# Patient Record
Sex: Male | Born: 1950 | Race: Black or African American | Hispanic: No | Marital: Single | Smoking: Current every day smoker
Health system: Southern US, Community
[De-identification: ages and names within clinical notes are randomized; demographics above are authoritative.]

---

## 2020-01-26 ENCOUNTER — Encounter (HOSPITAL_COMMUNITY): Payer: Self-pay | Admitting: Neurology

## 2020-01-26 ENCOUNTER — Emergency Department (HOSPITAL_COMMUNITY): Payer: Medicare Other

## 2020-01-26 ENCOUNTER — Other Ambulatory Visit: Payer: Self-pay

## 2020-01-26 ENCOUNTER — Inpatient Hospital Stay (HOSPITAL_COMMUNITY)
Admission: EM | Admit: 2020-01-26 | Discharge: 2020-01-28 | DRG: 065 | Disposition: A | Discharge status: Home / Self Care | Payer: Medicare Other | Attending: Internal Medicine | Admitting: Internal Medicine

## 2020-01-26 DIAGNOSIS — N179 Acute kidney failure, unspecified: Secondary | ICD-10-CM | POA: Diagnosis not present

## 2020-01-26 DIAGNOSIS — I161 Hypertensive emergency: Secondary | ICD-10-CM | POA: Diagnosis not present

## 2020-01-26 DIAGNOSIS — R29706 NIHSS score 6: Secondary | ICD-10-CM | POA: Diagnosis present

## 2020-01-26 DIAGNOSIS — Z20822 Contact with and (suspected) exposure to covid-19: Secondary | ICD-10-CM | POA: Diagnosis present

## 2020-01-26 DIAGNOSIS — I610 Nontraumatic intracerebral hemorrhage in hemisphere, subcortical: Secondary | ICD-10-CM

## 2020-01-26 DIAGNOSIS — F17210 Nicotine dependence, cigarettes, uncomplicated: Secondary | ICD-10-CM | POA: Diagnosis present

## 2020-01-26 DIAGNOSIS — I10 Essential (primary) hypertension: Secondary | ICD-10-CM | POA: Diagnosis present

## 2020-01-26 DIAGNOSIS — R4781 Slurred speech: Secondary | ICD-10-CM | POA: Diagnosis present

## 2020-01-26 DIAGNOSIS — G8194 Hemiplegia, unspecified affecting left nondominant side: Secondary | ICD-10-CM | POA: Diagnosis present

## 2020-01-26 DIAGNOSIS — I6389 Other cerebral infarction: Secondary | ICD-10-CM | POA: Diagnosis not present

## 2020-01-26 DIAGNOSIS — I619 Nontraumatic intracerebral hemorrhage, unspecified: Secondary | ICD-10-CM | POA: Diagnosis present

## 2020-01-26 LAB — CBC
HCT: 44.7 % (ref 39.0–52.0)
Hemoglobin: 15 g/dL (ref 13.0–17.0)
MCH: 30.9 pg (ref 26.0–34.0)
MCHC: 33.6 g/dL (ref 30.0–36.0)
MCV: 92.2 fL (ref 80.0–100.0)
Platelets: 262 10*3/uL (ref 150–400)
RBC: 4.85 MIL/uL (ref 4.22–5.81)
RDW: 12.9 % (ref 11.5–15.5)
WBC: 5.2 10*3/uL (ref 4.0–10.5)
nRBC: 0 % (ref 0.0–0.2)

## 2020-01-26 LAB — COMPREHENSIVE METABOLIC PANEL
ALT: 27 U/L (ref 0–44)
AST: 35 U/L (ref 15–41)
Albumin: 3.7 g/dL (ref 3.5–5.0)
Alkaline Phosphatase: 65 U/L (ref 38–126)
Anion gap: 9 (ref 5–15)
BUN: 14 mg/dL (ref 8–23)
CO2: 25 mmol/L (ref 22–32)
Calcium: 9.1 mg/dL (ref 8.9–10.3)
Chloride: 105 mmol/L (ref 98–111)
Creatinine, Ser: 1.25 mg/dL — ABNORMAL HIGH (ref 0.61–1.24)
GFR calc Af Amer: >60 mL/min (ref >60)
GFR calc non Af Amer: 58 mL/min — ABNORMAL LOW (ref >60)
Glucose, Bld: 86 mg/dL (ref 70–99)
Potassium: 4.2 mmol/L (ref 3.5–5.1)
Sodium: 139 mmol/L (ref 135–145)
Total Bilirubin: 0.8 mg/dL (ref 0.3–1.2)
Total Protein: 6.9 g/dL (ref 6.5–8.1)

## 2020-01-26 LAB — I-STAT CHEM 8, ED
BUN: 15 mg/dL (ref 8–23)
Calcium, Ion: 1.07 mmol/L — ABNORMAL LOW (ref 1.15–1.40)
Chloride: 106 mmol/L (ref 98–111)
Creatinine, Ser: 1 mg/dL (ref 0.61–1.24)
Glucose, Bld: 83 mg/dL (ref 70–99)
HCT: 45 % (ref 39.0–52.0)
Hemoglobin: 15.3 g/dL (ref 13.0–17.0)
Potassium: 4 mmol/L (ref 3.5–5.1)
Sodium: 139 mmol/L (ref 135–145)
TCO2: 22 mmol/L (ref 22–32)

## 2020-01-26 LAB — DIFFERENTIAL
Abs Immature Granulocytes: 0.01 10*3/uL (ref 0.00–0.07)
Basophils Absolute: 0.1 10*3/uL (ref 0.0–0.1)
Basophils Relative: 1 %
Eosinophils Absolute: 0.3 10*3/uL (ref 0.0–0.5)
Eosinophils Relative: 5 %
Immature Granulocytes: 0 %
Lymphocytes Relative: 23 %
Lymphs Abs: 1.2 10*3/uL (ref 0.7–4.0)
Monocytes Absolute: 0.5 10*3/uL (ref 0.1–1.0)
Monocytes Relative: 10 %
Neutro Abs: 3.1 10*3/uL (ref 1.7–7.7)
Neutrophils Relative %: 61 %

## 2020-01-26 LAB — RAPID URINE DRUG SCREEN, HOSP PERFORMED
Amphetamines: NOT DETECTED
Barbiturates: NOT DETECTED
Benzodiazepines: NOT DETECTED
Cocaine: NOT DETECTED
Opiates: NOT DETECTED
Tetrahydrocannabinol: POSITIVE — AB

## 2020-01-26 LAB — PROTIME-INR
INR: 1.1 (ref 0.8–1.2)
Prothrombin Time: 13.3 seconds (ref 11.4–15.2)

## 2020-01-26 LAB — URINALYSIS, ROUTINE W REFLEX MICROSCOPIC
Bilirubin Urine: NEGATIVE
Glucose, UA: NEGATIVE mg/dL
Hgb urine dipstick: NEGATIVE
Ketones, ur: NEGATIVE mg/dL
Leukocytes,Ua: NEGATIVE
Nitrite: NEGATIVE
Protein, ur: NEGATIVE mg/dL
Specific Gravity, Urine: 1.009 (ref 1.005–1.030)
pH: 6 (ref 5.0–8.0)

## 2020-01-26 LAB — HIV ANTIBODY (ROUTINE TESTING W REFLEX): HIV Screen 4th Generation wRfx: NONREACTIVE

## 2020-01-26 LAB — RESPIRATORY PANEL BY RT PCR (FLU A&B, COVID)
Influenza A by PCR: NEGATIVE
Influenza B by PCR: NEGATIVE
SARS Coronavirus 2 by RT PCR: NEGATIVE

## 2020-01-26 LAB — ETHANOL: Alcohol, Ethyl (B): <10 mg/dL (ref <10)

## 2020-01-26 LAB — MRSA PCR SCREENING: MRSA by PCR: NEGATIVE

## 2020-01-26 LAB — APTT: aPTT: 29 seconds (ref 24–36)

## 2020-01-26 MED ORDER — CLEVIDIPINE BUTYRATE 0.5 MG/ML IV EMUL
0.0000 mg/h | INTRAVENOUS | Status: DC
  Administered 2020-01-26: 4 mg/h via INTRAVENOUS
  Administered 2020-01-26: 6 mg/h via INTRAVENOUS
  Administered 2020-01-27 (×2): 2 mg/h via INTRAVENOUS
  Filled 2020-01-26 (×4): qty 50

## 2020-01-26 MED ORDER — STROKE: EARLY STAGES OF RECOVERY BOOK
Freq: Once | Status: AC
  Filled 2020-01-26 (×2): qty 1

## 2020-01-26 MED ORDER — SENNOSIDES-DOCUSATE SODIUM 8.6-50 MG PO TABS
1.0000 | ORAL_TABLET | Freq: Two times a day (BID) | ORAL | Status: DC
  Administered 2020-01-27 – 2020-01-28 (×2): 1 via ORAL
  Filled 2020-01-26 (×3): qty 1

## 2020-01-26 MED ORDER — ACETAMINOPHEN 325 MG PO TABS: 650.0000 mg | ORAL_TABLET | ORAL | Status: DC | PRN

## 2020-01-26 MED ORDER — CLEVIDIPINE BUTYRATE 0.5 MG/ML IV EMUL
INTRAVENOUS | Status: AC
  Administered 2020-01-26: 1 mg/h via INTRAVENOUS
  Filled 2020-01-26: qty 50

## 2020-01-26 MED ORDER — ACETAMINOPHEN 650 MG RE SUPP: 650.0000 mg | RECTAL | Status: DC | PRN

## 2020-01-26 MED ORDER — ACETAMINOPHEN 160 MG/5ML PO SOLN: 650.0000 mg | ORAL | Status: DC | PRN

## 2020-01-26 MED ORDER — SODIUM CHLORIDE 0.9 % IV SOLN: INTRAVENOUS | Status: DC

## 2020-01-26 MED ORDER — CHLORHEXIDINE GLUCONATE CLOTH 2 % EX PADS
6.0000 | MEDICATED_PAD | Freq: Every day | CUTANEOUS | Status: DC
  Administered 2020-01-27 – 2020-01-28 (×2): 6 via TOPICAL

## 2020-01-26 MED ORDER — LABETALOL HCL 5 MG/ML IV SOLN
20.0000 mg | Freq: Once | INTRAVENOUS | Status: AC
  Administered 2020-01-26: 20 mg via INTRAVENOUS

## 2020-01-26 MED ORDER — PANTOPRAZOLE SODIUM 40 MG IV SOLR
40.0000 mg | Freq: Every day | INTRAVENOUS | Status: DC
  Administered 2020-01-26: 40 mg via INTRAVENOUS
  Filled 2020-01-26: qty 40

## 2020-01-26 NOTE — H&P (Addendum)
Neurology history and physical  Reason for Consult: Code stroke Referring Physician: Estell Harpin  CC: Left-sided weakness  History is obtained from: Patient and also EMS  HPI: Paul Trevino is a 69 y.o. male with no past medical history.  Patient was at work when at 740 he noticed that he had generalized weakness.  Coworkers noted that he had left-sided facial droop along with left-sided weakness in the arm and leg.  EMS was called.  Patient's blood pressure was 180/90 in route.  Upon arriving to [facility] it was noted patient did have the left facial droop along with left-sided weakness.  Patient states that he does take aspirin however he does not take it on a regular basis.  Patient was immediately brought back to CT which revealed a right internal capsule intracranial hemorrhage.  Thus no TPA was given.  Patient's blood pressure was lowered with labetalol and if needed Cleviprex.  ED course  CT head shows-obtained and showed a right internal capsule hemorrhage  LKW: 0740 tpa given?: no, ICH Premorbid modified Rankin scale (mRS): 0  NIHSS 1a Level of Conscious.: 0 1b LOC Questions: 0 1c LOC Commands: 0 2 Best Gaze: 0 3 Visual: 0 4 Facial Palsy: 1 5a Motor Arm - left: 2 5b Motor Arm - Right: 0 6a Motor Leg - Left: 1 6b Motor Leg - Right: 0 7 Limb Ataxia: 2 8 Sensory: 0 9 Best Language: 0 10 Dysarthria: 0 11 Extinct. and Inatten.: 0 TOTAL: 6    ICH Score: 0    No past medical history on file. \ No family history on file.   Social History:   has no history on file for tobacco use, alcohol use, and drug use.  Medications  Current Facility-Administered Medications:     stroke: mapping our early stages of recovery book, , Does not apply, Once, Ulice Dash, PA-C   0.9 %  sodium chloride infusion, , Intravenous, Continuous, Ulice Dash, PA-C   acetaminophen (TYLENOL) tablet 650 mg, 650 mg, Oral, Q4H PRN **OR** acetaminophen (TYLENOL) 160 MG/5ML solution 650 mg, 650  mg, Per Tube, Q4H PRN **OR** acetaminophen (TYLENOL) suppository 650 mg, 650 mg, Rectal, Q4H PRN, Ulice Dash, PA-C   labetalol (NORMODYNE) injection 20 mg, 20 mg, Intravenous, Once **AND** clevidipine (CLEVIPREX) infusion 0.5 mg/mL, 0-21 mg/hr, Intravenous, Continuous, Ulice Dash, PA-C   pantoprazole (PROTONIX) injection 40 mg, 40 mg, Intravenous, QHS, Ulice Dash, PA-C   senna-docusate (Senokot-S) tablet 1 tablet, 1 tablet, Oral, BID, Ulice Dash, PA-C No current outpatient medications on file.  ROS:   General ROS: negative for - chills, fatigue, fever, night sweats, weight gain or weight loss Psychological ROS: negative for - behavioral disorder, hallucinations, memory difficulties, mood swings or suicidal ideation Ophthalmic ROS: negative for - blurry vision, double vision, eye pain or loss of vision ENT ROS: negative for - epistaxis, nasal discharge, oral lesions, sore throat, tinnitus or vertigo Allergy and Immunology ROS: negative for - hives or itchy/watery eyes Hematological and Lymphatic ROS: negative for - bleeding problems, bruising or swollen lymph nodes Endocrine ROS: negative for - galactorrhea, hair pattern changes, polydipsia/polyuria or temperature intolerance Respiratory ROS: negative for - cough, hemoptysis, shortness of breath or wheezing Cardiovascular ROS: negative for - chest pain, dyspnea on exertion, edema or irregular heartbeat Gastrointestinal ROS: negative for - abdominal pain, diarrhea, hematemesis, nausea/vomiting or stool incontinence Genito-Urinary ROS: negative for - dysuria, hematuria, incontinence or urinary frequency/urgency Musculoskeletal ROS: Positive for -  muscular weakness  Neurological ROS: as noted in HPI Dermatological ROS: negative for rash and skin lesion changes  Exam: Current vital signs: Wt 60.7 kg  Vital signs in last 24 hours: Weight:  [60.7 kg] 60.7 kg (09/30 0800)   Constitutional: Appears well-developed and  well-nourished.  Psych: Affect appropriate to situation Eyes: No scleral injection HENT: No OP obstrucion Head: Normocephalic.  Cardiovascular: Normal rate and regular rhythm.  Respiratory: Effort normal, non-labored breathing GI: Soft.  No distension. There is no tenderness.  Skin: WDI  Neuro: Mental Status: Patient is awake, alert, oriented to person, place, month, year, and situation.  Patient does not show any dysarthria.  Naming, repeating, comprehension is intact.  Patient is able to give a history.  Patient is able to follow commands without difficulty.  Cranial Nerves: II: Visual Fields are full.  III,IV, VI: EOMI without ptosis or diploplia. Pupils equal, round and reactive to light V: Facial sensation is symmetric to temperature VII: Slight left facial droop.  VIII: hearing is intact to voice X: Palat elevates symmetrically XI: Shoulder shrug is symmetric. XII: tongue is midline without atrophy or fasciculations.  Motor: Right arm and leg 5/5 left arm 5/5 and left leg antigravity Positive drift on both left arm and leg Sensory: Sensation is symmetric to light touch and temperature in the arms and legs. Deep Tendon Reflexes: 2+ and symmetric in the biceps and patellae.  It is noticed that he has crossed adduction with patella reflex Plantars: Upgoing on the left downgoing on the right Cerebellar: Finger-nose-finger showed dysmetria in bilateral arms and left leg mostly due to strength however  Labs I have reviewed labs in epic and the results pertinent to this consultation are:   CBC No results found for: WBC, RBC, HGB, HCT, PLT, MCV, MCH, MCHC, RDW, LYMPHSABS, MONOABS, EOSABS, BASOSABS  CMP  No results found for: NA, K, CL, CO2, GLUCOSE, BUN, CREATININE, CALCIUM, PROT, ALBUMIN, AST, ALT, ALKPHOS, BILITOT, GFRNONAA, GFRAA  Lipid Panel  No results found for: CHOL, TRIG, HDL, CHOLHDL, VLDL, LDLCALC, LDLDIRECT   Imaging I have reviewed the images  obtained:  CT-scan of the brain-right basal ganglier hemorrhage likely 3 to 4 cc.  Felicie Morn PA-C [facility] [phone]  M-F  (9:00 am- 5:00 PM)  01/26/2020, 9:12 AM     Assessment:  This is a 69 year old male with no past medical history and on no anticoagulants.  Patient arrived with left-sided symptoms of weakness.  Patient was immediately brought to CT scan which showed a right basal ganglia hemorrhage with likely 3 to 4 cc volume.  Exam as above, did show drift on left arm and leg with weakness.  Patient also showed a slight left facial droop.  Hemorrhage likely secondary to hypertension.  Impression:  -Left basal ganglier hemorrhage.  Plan -Admit to ICU -MRI of the brain without contrast -MRA Head and neck  -Transthoracic Echo,  -No anticoagulants or aspirin -Start or continue Atorvastatin 80 mg/other high intensity statin -BP goal: Less than 140 systolically and less than 90 diastolically-initially try labetalol if not sufficient start Cleviprex -HBAIC and Lipid profile -Telemetry monitoring -Frequent neuro checks -NPO until passes stroke swallow screen -PT/OT -SCDs for DVT prophylaxis    Patient seen and examined with Andres Labrum, PA. Agree with assessment and plan.  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 Patient needs admission to ICU for close monitoring and risk factor  identification and control.   Marisue Humble, MD Page: [phone]

## 2020-01-26 NOTE — ED Triage Notes (Signed)
Pt from  work via ems; LKW 0740 this am; pt's coworkers noted gait and speech changes and L facial droop; pt A and O x 4 on arrival

## 2020-01-26 NOTE — ED Provider Notes (Signed)
[facility] - Closed EMERGENCY DEPARTMENT Provider Note   CSN: [id number] Arrival date & time: 01/26/20  1540     History Chief Complaint  Patient presents with  . Code Stroke    Paul Trevino is a 69 y.o. male.  Patient presents today with weakness of his left arm and left leg with some slurred speech this started while he was at work around 720.  The history is provided by the patient and the EMS personnel. No language interpreter was used.  Weakness Severity:  Moderate Onset quality:  Sudden Timing:  Constant Progression:  Worsening Chronicity:  New Context: not alcohol use   Relieved by:  Nothing Worsened by:  Nothing Ineffective treatments:  None tried Associated symptoms: no abdominal pain, no chest pain, no cough, no diarrhea, no frequency, no headaches and no seizures        History reviewed. No pertinent past medical history.  Patient Active Problem List   Diagnosis Date Noted  . ICH (intracerebral hemorrhage) (HCC) 01/26/2020    History reviewed. No pertinent surgical history.     No family history on file.  Social History   Tobacco Use  . Smoking status: Current Every Day Smoker    Packs/day: 0.50  . Smokeless tobacco: Never Used  Substance Use Topics  . Alcohol use: Not on file  . Drug use: Not on file    Home Medications Prior to Admission medications   Not on File    Allergies    Patient has no known allergies.  Review of Systems   Review of Systems  Constitutional: Negative for appetite change and fatigue.  HENT: Negative for congestion, ear discharge and sinus pressure.   Eyes: Negative for discharge.  Respiratory: Negative for cough.   Cardiovascular: Negative for chest pain.  Gastrointestinal: Negative for abdominal pain and diarrhea.  Genitourinary: Negative for frequency and hematuria.  Musculoskeletal: Negative for back pain.  Skin: Negative for rash.  Neurological: Positive for weakness. Negative for seizures  and headaches.  Psychiatric/Behavioral: Negative for hallucinations.    Physical Exam Updated Vital Signs BP (!) 159/90   Resp 12   Ht 5\' 10"  (1.778 m)   Wt 60.7 kg   BMI 19.20 kg/m   Physical Exam Vitals and nursing note reviewed.  Constitutional:      Appearance: He is well-developed.  HENT:     Head: Normocephalic.     Nose: Nose normal.  Eyes:     General: No scleral icterus.    Conjunctiva/sclera: Conjunctivae normal.  Neck:     Thyroid: No thyromegaly.  Cardiovascular:     Rate and Rhythm: Normal rate and regular rhythm.     Heart sounds: No murmur heard.  No friction rub. No gallop.   Pulmonary:     Breath sounds: No stridor. No wheezing or rales.  Chest:     Chest wall: No tenderness.  Abdominal:     General: There is no distension.     Tenderness: There is no abdominal tenderness. There is no rebound.  Musculoskeletal:        General: No swelling.     Cervical back: Neck supple.  Lymphadenopathy:     Cervical: No cervical adenopathy.  Skin:    Findings: No erythema or rash.  Neurological:     Mental Status: He is alert and oriented to person, place, and time.     Motor: No abnormal muscle tone.     Coordination: Coordination normal.     Comments: Profound  weakness to left arm and left leg.  Minimal slurred speech  Psychiatric:        Behavior: Behavior normal.     ED Results / Procedures / Treatments   Labs (all labs ordered are listed, but only abnormal results are displayed) Labs Reviewed  I-STAT CHEM 8, ED - Abnormal; Notable for the following components:      Result Value   Calcium, Ion 1.07 (*)    All other components within normal limits  RESPIRATORY PANEL BY RT PCR (FLU A&B, COVID)  CBC  DIFFERENTIAL  ETHANOL  PROTIME-INR  APTT  COMPREHENSIVE METABOLIC PANEL  RAPID URINE DRUG SCREEN, HOSP PERFORMED  URINALYSIS, ROUTINE W REFLEX MICROSCOPIC  HIV ANTIBODY (ROUTINE TESTING W REFLEX)    EKG None  Radiology No results  found.  Procedures Procedures (including critical care time)  Medications Ordered in ED Medications   stroke: mapping our early stages of recovery book (has no administration in time range)  acetaminophen (TYLENOL) tablet 650 mg (has no administration in time range)    Or  acetaminophen (TYLENOL) 160 MG/5ML solution 650 mg (has no administration in time range)    Or  acetaminophen (TYLENOL) suppository 650 mg (has no administration in time range)  senna-docusate (Senokot-S) tablet 1 tablet (has no administration in time range)  pantoprazole (PROTONIX) injection 40 mg (has no administration in time range)  0.9 %  sodium chloride infusion (has no administration in time range)  labetalol (NORMODYNE) injection 20 mg (has no administration in time range)    And  clevidipine (CLEVIPREX) infusion 0.5 mg/mL (has no administration in time range)    ED Course  I have reviewed the triage vital signs and the nursing notes.  Pertinent labs & imaging results that were available during my care of the patient were reviewed by me and considered in my medical decision making (see chart for details).    CRITICAL CARE Performed by: Bethann Berkshire Total critical care time:35 minutes Critical care time was exclusive of separately billable procedures and treating other patients. Critical care was necessary to treat or prevent imminent or life-threatening deterioration. Critical care was time spent personally by me on the following activities: development of treatment plan with patient and/or surrogate as well as nursing, discussions with consultants, evaluation of patient's response to treatment, examination of patient, obtaining history from patient or surrogate, ordering and performing treatments and interventions, ordering and review of laboratory studies, ordering and review of radiographic studies, pulse oximetry and re-evaluation of patient's condition.  MDM Rules/Calculators/A&P                          Patient with cerebral hemorrhage.  Neurology will admit     This patient presents to the ED for concern of weakness, this involves an extensive number of treatment options, and is a complaint that carries with it a high risk of complications and morbidity.  The differential diagnosis includes stroke   Lab Tests:   I Ordered, reviewed, and interpreted labs, which included CBC chemistries  Medicines ordered:     Imaging Studies ordered:   I ordered imaging studies which included CT head  I independently visualized and interpreted imaging which showed cerebral hemorrhage  Additional history obtained:   Additional history obtained from EMS  Previous records obtained and reviewed.  Consultations Obtained:   I consulted neurology and discussed lab and imaging findings  Reevaluation:  After the interventions stated above, I reevaluated the patient and  found no change  Critical Interventions:  .    Final Clinical Impression(s) / ED Diagnoses Final diagnoses:  None    Rx / DC Orders ED Discharge Orders    None       Bethann Berkshire, MD 01/26/20 [phone]

## 2020-01-26 NOTE — Code Documentation (Addendum)
Stroke Response Nurse Documentation Code Documentation  Paul Trevino is a 69 y.o. male arriving to [facility] ED via [facility] on 01/26/20 with past medical hx of smoker. Code stroke was activated by EMS. Patient from work where he was LKW at 0740 and now complaining of gait change, left side weakness and slurred speech.    Stroke team at the bedside on patient arrival. Patient to CT with team. Labs drawn and patient cleared by Dr. Estell Harpin in CT. NIHSS 4, see documentation for details and code stroke times. Patient with left facial droop, left arm weakness, left leg weakness and left limb ataxia on exam. The following imaging was completed: CT.   Patient is not a candidate for tPA due to hemorrhage. SBP 165 in CT. 10mg  labetalol IV given. Patient taken to ED room. SBP 159 on recheck. Order for cleviprex placed per protocol and gtt started with SBP goal 100-140. SBP within parameters 0936. R pupil larger than left. Both reactive. Neurologist aware. Care/Plan admit to ICU. BP control and hourly VS/ mNIHSS with pupil checks. Bedside handoff with ED RN Hannie.    Stroke Response RN

## 2020-01-26 NOTE — Progress Notes (Signed)
PT Cancellation Note  Patient Details Name: Paul Trevino MRN: [id number] DOB: 01-12-1951   Cancelled Treatment:    Reason Eval/Treat Not Completed: Medical issues which prohibited therapy Pt with active bed rest orders and being admitted to ICU. Will follow up when pt medically appropriate and as schedule allows.   Cindee Salt, DPT  Acute Rehabilitation Services  Pager: [phone] Office: [phone]    Lehman Prom 01/26/2020, 10:35 AM

## 2020-01-27 ENCOUNTER — Inpatient Hospital Stay (HOSPITAL_COMMUNITY): Payer: Medicare Other

## 2020-01-27 DIAGNOSIS — I6389 Other cerebral infarction: Secondary | ICD-10-CM

## 2020-01-27 LAB — ECHOCARDIOGRAM COMPLETE
Area-P 1/2: 1.83 cm2
Height: 70 in
S' Lateral: 2.2 cm
Weight: 2130.53 oz

## 2020-01-27 LAB — TRIGLYCERIDES: Triglycerides: 78 mg/dL (ref <150)

## 2020-01-27 MED ORDER — PANTOPRAZOLE SODIUM 40 MG PO TBEC
40.0000 mg | DELAYED_RELEASE_TABLET | Freq: Every day | ORAL | Status: DC
  Administered 2020-01-27 – 2020-01-28 (×2): 40 mg via ORAL
  Filled 2020-01-27: qty 1

## 2020-01-27 MED ORDER — AMLODIPINE BESYLATE 5 MG PO TABS
5.0000 mg | ORAL_TABLET | Freq: Every day | ORAL | Status: DC
  Administered 2020-01-27 – 2020-01-28 (×2): 5 mg via ORAL
  Filled 2020-01-27 (×2): qty 1

## 2020-01-27 MED ORDER — CLEVIDIPINE BUTYRATE 0.5 MG/ML IV EMUL: 0.0000 mg/h | INTRAVENOUS | Status: DC

## 2020-01-27 NOTE — Evaluation (Signed)
Speech Language Pathology Evaluation Patient Details Name: Paul Trevino MRN: [id number] DOB: [birth year] Today's Date: 01/27/2020 Time: 3664-4034 SLP Time Calculation (min) (ACUTE ONLY): 40 min  Problem List:  Patient Active Problem List   Diagnosis Date Noted  . ICH (intracerebral hemorrhage) (HCC) 01/26/2020   Past Medical History: History reviewed. No pertinent past medical history. Past Surgical History: History reviewed. No pertinent surgical history. HPI:  69 y.o. male with medical hx unavailable (lived in [place] area) admitted with left-sided weakness and facial asymmetry.  CT revealed acute hematoma at the right putamen and internal capsule. Pt independent PTA, works as a Merchandiser, retail at [employer]; lives alone; daughter Paul Trevino is available should 24 hour supervision be necessary.    Assessment / Plan / Recommendation Clinical Impression  Pt presents with mild deficits s/p right subcortical hematoma.  He is conversant, with functional attention during conversational tasks and when ordering from menu and talking with several people at once.  Expressive/receptive language are normal.  Speech is clear, without dysarthria, and fluent.  Pt participated in the SLUMS, achieving a score of 24/30, suggestive of mild cognitive disorder. Items missed were specific to visual/perceptual tasks and one short-term recall item missed (13/15).  Pt demonstrated strengths in attention, verbal recall, mental flexibility, and pragmatic communication.  He demonstrated impairments in awareness, requiring cues to identify errored responses.  OT/PT assessments are pending; from SLP perspective, pt may benefit from further assessment by SLP in OP setting given his level of responsibilities and independence PTA.  He will benefit from 24 hour supervision initially.     SLP Assessment  SLP Recommendation/Assessment: Patient needs continued Speech Lanaguage Pathology Services    Follow Up Recommendations  Outpatient  SLP    Frequency and Duration min 1 x/week  1 week      SLP Evaluation Cognition  Overall Cognitive Status: Impaired/Different from baseline Arousal/Alertness: Awake/alert Orientation Level: Oriented X4 Attention: Selective Selective Attention: Appears intact Memory: Appears intact Awareness: Impaired Awareness Impairment: Emergent impairment Problem Solving: Appears intact Safety/Judgment: Appears intact       Comprehension  Auditory Comprehension Overall Auditory Comprehension: Appears within functional limits for tasks assessed Visual Recognition/Discrimination Discrimination: Within Function Limits Reading Comprehension Reading Status: Within funtional limits    Expression Expression Primary Mode of Expression: Verbal Verbal Expression Overall Verbal Expression: Appears within functional limits for tasks assessed Written Expression Dominant Hand: Right   Oral / Motor  Oral Motor/Sensory Function Overall Oral Motor/Sensory Function: Mild impairment Facial Symmetry: Abnormal symmetry left;Suspected CN VII (facial) dysfunction Lingual ROM: Within Functional Limits Lingual Symmetry: Within Functional Limits Mandible: Within Functional Limits Motor Speech Overall Motor Speech: Appears within functional limits for tasks assessed   GO                    Paul Trevino Paul Trevino 01/27/2020, 11:24 AM   Paul Trevino Folks L. Paul Frederic, MA CCC/SLP Acute Rehabilitation Services Office number [phone] Pager [phone]

## 2020-01-27 NOTE — Progress Notes (Signed)
Paul Trevino is a 69 y.o. male patient admitted from ED awake, alert - oriented  X 4 - no acute distress noted.  VSS - Blood pressure 121/74, pulse 66, temperature 98.5 F (36.9 C), temperature source Oral, resp. rate 18, height 5\' 10"  (1.778 m), weight 60.4 kg, SpO2 98 %.    IV in place, occlusive dsg intact without redness.  Orientation to room, and floor completed with information packet given to patient/family.  Patient declined safety video at this time.  Admission INP armband ID verified with patient/family, and in place.   SR up x 2, fall assessment complete, with patient and able to verbalize understanding of risk associated with falls, and verbalized understanding to call nsg before up out of bed.  Call light within reach, patient able to voice, and demonstrate understanding.  Skin, clean-dry- intact without evidence of bruising, or skin tears.   No evidence of skin break down noted on exam.     Will cont to eval and treat per MD orders.  , RN 01/27/2020 5:44 PM

## 2020-01-27 NOTE — Evaluation (Signed)
Physical Therapy Evaluation Patient Details Name: Paul Trevino MRN: [id number] DOB: February 11, 1951 Today's Date: 01/27/2020   History of Present Illness  69 yo male admitted to [facility] on 9/30 with L facial droop and L weakness, Imaging reveals R internal capsule ICH. PMH includes smoking.  Clinical Impression   Pt presents with LUE weakness and incoordination, decreased awareness of deficits with mild inattention to L observed during hallway ambulation, impaired standing balance, and decreased activity tolerance vs baseline. Pt to benefit from acute PT to address deficits. Pt ambulated hallway distance requiring min assist to steady and recover LOB when PT challenging pt dynamic balance. PTA, pt was completely independent with ADLs and mobility, working full time in [employer]. Per pt, on d/c his sisters and daughter can provide 24/7 assist as needed. PT recommending OPPT to address higher level balance deficits and mild incoordination. PT to progress mobility as tolerated, and will continue to follow acutely.      Follow Up Recommendations Outpatient PT;Supervision for mobility/OOB    Equipment Recommendations  None recommended by PT    Recommendations for Other Services       Precautions / Restrictions Precautions Precautions: Fall Restrictions Weight Bearing Restrictions: No      Mobility  Bed Mobility Overal bed mobility: Needs Assistance Bed Mobility: Supine to Sit     Supine to sit: Min guard;HOB elevated     General bed mobility comments: for safety, increased time with use of HOB elevation to perform.  Transfers Overall transfer level: Needs assistance Equipment used: None Transfers: Sit to/from Stand Sit to Stand: Min guard;From elevated surface         General transfer comment: for safety, pt with self-steadying upon standing.  Ambulation/Gait Ambulation/Gait assistance: Min assist Gait Distance (Feet): 250 Feet Assistive device: None Gait Pattern/deviations:  Step-through pattern;Decreased stride length;Staggering left Gait velocity: decr   General Gait Details: Min assist to steady, correct LOB x2 towards L. Pt with stagger and leaning towards L during gait, especially with dynamic balance challenges.  Stairs            Wheelchair Mobility    Modified Rankin (Stroke Patients Only) Modified Rankin (Stroke Patients Only) Pre-Morbid Rankin Score: No symptoms Modified Rankin: Moderately severe disability     Balance Overall balance assessment: Needs assistance Sitting-balance support: No upper extremity supported;Feet supported Sitting balance-Leahy Scale: Good     Standing balance support: No upper extremity supported;During functional activity Standing balance-Leahy Scale: Fair Standing balance comment: LOB x2 with standing balance challenges (see below)             High level balance activites: Backward walking;Direction changes;Turns;Head turns;Other (comment) High Level Balance Comments: WFL: step over obstacle. Mild impairment (increased time, evidence of mild imbalance): horizontal head turns, backwards walking. Moderate impairment (staggering, significant change in speed, LOB requiring min assist to correct): vertical head turns, turn and stop             Pertinent Vitals/Pain Pain Assessment: No/denies pain    Home Living Family/patient expects to be discharged to:: Private residence Living Arrangements: Parent (26 yo mother) Available Help at Discharge: Family;Available 24 hours/day (daughter, two sisters\) Type of Home: House Home Access: Stairs to enter Entrance Stairs-Rails: Can reach both Entrance Stairs-Number of Steps: 5 Home Layout: One level Home Equipment: Walker - 4 wheels;Walker - 2 wheels Additional Comments: mother uses    Prior Function Level of Independence: Independent         Comments: completely independent, working at [employer] full time, driving     Hand Dominance   Dominant  Hand: Right    Extremity/Trunk Assessment   Upper Extremity Assessment Upper Extremity Assessment: Defer to OT evaluation (+ L extinction when touching both R and LUE, mild incoordination vs weakness with finger-to-nose)    Lower Extremity Assessment Lower Extremity Assessment: Overall WFL for tasks assessed (strength 5/5 symmetric, assessed via MMT. heel to shin test [facility] bilaterally)    Cervical / Trunk Assessment Cervical / Trunk Assessment: Normal  Communication   Communication: No difficulties  Cognition Arousal/Alertness: Awake/alert Behavior During Therapy: WFL for tasks assessed/performed Overall Cognitive Status: Impaired/Different from baseline Area of Impairment: Safety/judgement;Problem solving                         Safety/Judgement: Decreased awareness of safety;Decreased awareness of deficits   Problem Solving: Requires verbal cues;Requires tactile cues General Comments: unsteadiness in hallway, PT had to call pt attention to it. L incoordination with poor awareness of LUE position in space, i.e. dropped his phone on his face when trying to talk to daughter      General Comments General comments (skin integrity, edema, etc.): vss    Exercises     Assessment/Plan    PT Assessment Patient needs continued PT services  PT Problem List Decreased strength;Decreased mobility;Decreased safety awareness;Decreased knowledge of precautions;Decreased coordination;Decreased balance;Decreased activity tolerance       PT Treatment Interventions DME instruction;Therapeutic activities;Therapeutic exercise;Patient/family education;Gait training;Balance training;Stair training;Functional mobility training;Neuromuscular re-education    PT Goals (Current goals can be found in the Care Plan section)  Acute Rehab PT Goals Patient Stated Goal: back to working, being independent PT Goal Formulation: With patient Time For Goal Achievement: 02/10/20 Potential to Achieve  Goals: Good    Frequency Min 4X/week   Barriers to discharge        Co-evaluation               AM-PAC PT "6 Clicks" Mobility  Outcome Measure Help needed turning from your back to your side while in a flat bed without using bedrails?: A Little Help needed moving from lying on your back to sitting on the side of a flat bed without using bedrails?: A Little Help needed moving to and from a bed to a chair (including a wheelchair)?: A Little Help needed standing up from a chair using your arms (e.g., wheelchair or bedside chair)?: A Little Help needed to walk in hospital room?: A Little Help needed climbing 3-5 steps with a railing? : A Lot 6 Click Score: 17    End of Session Equipment Utilized During Treatment: Gait belt Activity Tolerance: Patient tolerated treatment well;Patient limited by fatigue Patient left: in chair;with call bell/phone within reach;with chair alarm set Nurse Communication: Mobility status PT Visit Diagnosis: Other abnormalities of gait and mobility (R26.89);Hemiplegia and hemiparesis Hemiplegia - Right/Left: Left Hemiplegia - dominant/non-dominant: Non-dominant Hemiplegia - caused by: Nontraumatic intracerebral hemorrhage    Time: 4166-0630 PT Time Calculation (min) (ACUTE ONLY): 24 min   Charges:   PT Evaluation $PT Eval Low Complexity: 1 Low PT Treatments $Gait Training: 8-22 mins       Mehdi Gironda E, PT Acute Rehabilitation Services Pager [phone]  Office [phone]    Tejah Brekke D Despina Hidden 01/27/2020, 4:26 PM

## 2020-01-27 NOTE — Progress Notes (Signed)
  Echocardiogram 2D Echocardiogram has been performed.  Paul Trevino 01/27/2020, 10:34 AM

## 2020-01-27 NOTE — Progress Notes (Addendum)
STROKE TEAM PROGRESS NOTE   HISTORY OF PRESENT ILLNESS (per record) Paul Trevino is a 69 y.o. male with no past medical history.  Patient was at work when at 740 he noticed that he had generalized weakness.  Coworkers noted that he had left-sided facial droop along with left-sided weakness in the arm and leg.  EMS was called.  Patient's blood pressure was 180/90 in route.  Upon arriving to [facility] it was noted patient did have the left facial droop along with left-sided weakness.  Patient states that he does take aspirin however he does not take it on a regular basis.  Patient was immediately brought back to CT which revealed a right internal capsule intracranial hemorrhage.  Thus no TPA was given.  Patient's blood pressure was lowered with labetalol and if needed Cleviprex. ED course  CT head shows-obtained and showed a right internal capsule hemorrhage LKW: 0740 tpa given?: no, ICH Premorbid modified Rankin scale (mRS): 0 NIHSS 6  ICH Score: 0   INTERVAL HISTORY He is sitting up in bed comfortably.  He states left-sided weakness is improving.  Blood pressure adequately controlled on Cleviprex drip.  He denies prior known history of hypertension or taking any blood pressure medicines.  Urine drug screen is positive for marijuana.  CT angiogram are pending.  MRI scan shows stable appearance of the right basal ganglia hemorrhage with minimum cytotoxic edema and no intraventricular hemorrhage or midline shift    OBJECTIVE Vitals:   01/27/20 0200 01/27/20 0300 01/27/20 0400 01/27/20 0500  BP: 116/67 125/73 122/73 (!) 160/85  Pulse: (!) 58 63 (!) 59 (!) 56  Resp: 15 (!) 27 15 (!) 24  Temp:      TempSrc:      SpO2: 97% 97% 95% 99%  Weight:      Height:        CBC:  Recent Labs  Lab 01/26/20 0900 01/26/20 0909  WBC 5.2  --   NEUTROABS 3.1  --   HGB 15.0 15.3  HCT 44.7 45.0  MCV 92.2  --   PLT 262  --     Basic Metabolic Panel:  Recent Labs  Lab 01/26/20 0900 01/26/20 0909   NA 139 139  K 4.2 4.0  CL 105 106  CO2 25  --   GLUCOSE 86 83  BUN 14 15  CREATININE 1.25* 1.00  CALCIUM 9.1  --     Lipid Panel: No results found for: CHOL, TRIG, HDL, CHOLHDL, VLDL, LDLCALC HgbA1c: No results found for: HGBA1C Urine Drug Screen:     Component Value Date/Time   LABOPIA NONE DETECTED 01/26/2020 1302   COCAINSCRNUR NONE DETECTED 01/26/2020 1302   LABBENZ NONE DETECTED 01/26/2020 1302   AMPHETMU NONE DETECTED 01/26/2020 1302   THCU POSITIVE (A) 01/26/2020 1302   LABBARB NONE DETECTED 01/26/2020 1302    Alcohol Level     Component Value Date/Time   ETH <10 01/26/2020 0900    IMAGING  CT HEAD WO CONTRAST 01/27/2020 IMPRESSION:  Unchanged acute hematoma at the right basal ganglia.   CT HEAD CODE STROKE WO CONTRAST 01/26/2020 IMPRESSION:  1. Right internal capsule acute hemorrhage. 4.2 mL of blood. Probable hypertensive hemorrhage. No underlying mass identified. Ventricle size normal.  2. Chronic microvascular ischemic changes in the white matter.    ECG - SR rate 65 BPM. (See cardiology reading for complete details)   PHYSICAL EXAM Blood pressure (!) 160/85, pulse (!) 56, temperature 97.8 F (36.6 C), temperature source Oral,  resp. rate (!) 24, height 5\' 10"  (1.778 m), weight 60.4 kg, SpO2 99 %. Pleasant frail middle-age African-American male not in distress. . Afebrile. Head is nontraumatic. Neck is supple without bruit.    Cardiac exam no murmur or gallop. Lungs are clear to auscultation. Distal pulses are well felt. Neurological Exam :  Awake alert oriented x 3 normal speech and language. Mild left lower face asymmetry. Tongue midline. No drift. Mild diminished fine finger movements on left. Orbits right over left upper extremity. Mild left grip weak.. Mild left hip flexor weakness normal sensation . Normal coordination.     ASSESSMENT/PLAN [name] is a 69 y.o. male with a history of tobacco use who presented with generalized weakness ->  left-sided facial droop along with left-sided weakness in the arm and leg ; BP 180/90 . CT revealed a right internal capsule intracranial hemorrhage.  He did not receive IV t-PA due to ICH.  Stroke: Right internal capsule acute hemorrhage. Probable hypertensive hemorrhage.  Resultant mild left hemiparesis  Code Stroke CT Head - Right internal capsule acute hemorrhage. 4.2 mL of blood. Probable hypertensive hemorrhage. No underlying mass identified. Ventricle size normal. Chronic microvascular ischemic changes in the white matter  CT head - Unchanged acute hematoma at the right basal ganglia.   MRI head -stable right posterior putamen and basal ganglia hematoma   MRA head - not ordered  CTA H&N - not ordered  CT Perfusion - not ordered  Carotid Doppler - not ordered  2D Echo - not ordered  78 Virus 2 - negative  LDL - pending  HgbA1c - pending  UDS - THCU  VTE prophylaxis - SCDs Diet  Diet Order            Diet regular Room service appropriate? Yes with Assist; Fluid consistency: Thin  Diet effective now                 No antithrombotic prior to admission, now on No antithrombotic  Ongoing aggressive stroke risk factor management  Therapy recommendations:  pending  Disposition:  Pending  Hypertension  Home BP meds: none  Current BP meds: prn Labetalol and Cleviprex  Stable . SBP goal < 140 mm Hg initially . Long-term BP goal normotensive  Hyperlipidemia  Home Lipid lowering medication: none   LDL pending, goal < 70  Current lipid lowering medication: None (statin contraindicated with ICH)  Continue statin at discharge  Other Stroke Risk Factors  Advanced age  Cigarette smoker - advised to stop smoking  UDS positive for [facility]  Other Active Problems  Code status - Full code  AKI - resolved - creatinine - 1.25->1.00   Hospital day # 1 Patient presented with hypertensive right basal ganglia hemorrhage.  Recommend close  neurological monitoring and strict blood pressure control as per ICH protocol.  Keep systolic blood pressure below [facility] for 24 hours and then below 160.  Start Norvasc 5 mg daily and taper Cleviprex drip as tolerated.  Mobilize out of bed.  Therapy consults.  Check echocardiogram, CT angiogram brain and neck, LDL and hemoglobin A1c.  Patient counseled to quit smoking marijuana and cigarettes and is agreeable. This patient is critically ill and at significant risk of neurological worsening, death and care requires constant monitoring of vital signs, hemodynamics,respiratory and cardiac monitoring, extensive review of multiple databases, frequent neurological assessment, discussion with family, other specialists and medical decision making of high complexity.I have made any additions or clarifications directly to the above  note.This critical care time does not reflect procedure time, or teaching time or supervisory time of PA/NP/Med Resident etc but could involve care discussion time.  I spent 32 minutes of neurocritical care time  in the care of  this patient.  Case discussed with Dr. Susa Raring medical hospitalist who will resume care of patient after patient is transferred to the floor beginning tomorrow    Delia Heady, MD To contact Stroke Continuity provider, please refer to [email or web address]. After hours, contact General Neurology

## 2020-01-28 ENCOUNTER — Inpatient Hospital Stay (HOSPITAL_COMMUNITY): Payer: Medicare Other

## 2020-01-28 DIAGNOSIS — I610 Nontraumatic intracerebral hemorrhage in hemisphere, subcortical: Principal | ICD-10-CM

## 2020-01-28 DIAGNOSIS — I161 Hypertensive emergency: Secondary | ICD-10-CM

## 2020-01-28 LAB — CBC
HCT: 43.1 % (ref 39.0–52.0)
Hemoglobin: 14.4 g/dL (ref 13.0–17.0)
MCH: 31 pg (ref 26.0–34.0)
MCHC: 33.4 g/dL (ref 30.0–36.0)
MCV: 92.9 fL (ref 80.0–100.0)
Platelets: 238 10*3/uL (ref 150–400)
RBC: 4.64 MIL/uL (ref 4.22–5.81)
RDW: 13.1 % (ref 11.5–15.5)
WBC: 5.4 10*3/uL (ref 4.0–10.5)
nRBC: 0 % (ref 0.0–0.2)

## 2020-01-28 LAB — HEMOGLOBIN A1C
Hgb A1c MFr Bld: 6 % — ABNORMAL HIGH (ref 4.8–5.6)
Mean Plasma Glucose: 125.5 mg/dL

## 2020-01-28 LAB — LIPID PANEL
Cholesterol: 137 mg/dL (ref 0–200)
HDL: 61 mg/dL (ref >40)
LDL Cholesterol: 65 mg/dL (ref 0–99)
Total CHOL/HDL Ratio: 2.2 RATIO
Triglycerides: 56 mg/dL (ref <150)
VLDL: 11 mg/dL (ref 0–40)

## 2020-01-28 LAB — BASIC METABOLIC PANEL
Anion gap: 9 (ref 5–15)
BUN: 10 mg/dL (ref 8–23)
CO2: 22 mmol/L (ref 22–32)
Calcium: 8.8 mg/dL — ABNORMAL LOW (ref 8.9–10.3)
Chloride: 104 mmol/L (ref 98–111)
Creatinine, Ser: 1.06 mg/dL (ref 0.61–1.24)
GFR calc Af Amer: >60 mL/min (ref >60)
GFR calc non Af Amer: >60 mL/min (ref >60)
Glucose, Bld: 103 mg/dL — ABNORMAL HIGH (ref 70–99)
Potassium: 3.9 mmol/L (ref 3.5–5.1)
Sodium: 135 mmol/L (ref 135–145)

## 2020-01-28 MED ORDER — AMLODIPINE BESYLATE 5 MG PO TABS
5.0000 mg | ORAL_TABLET | Freq: Every day | ORAL | 0 refills | Status: DC
Start: 2020-01-29 — End: 2020-07-12

## 2020-01-28 MED ORDER — LABETALOL HCL 5 MG/ML IV SOLN
10.0000 mg | Freq: Once | INTRAVENOUS | Status: AC
  Administered 2020-01-28: 10 mg via INTRAVENOUS
  Filled 2020-01-28: qty 4

## 2020-01-28 MED ORDER — IOHEXOL 350 MG/ML SOLN
100.0000 mL | Freq: Once | INTRAVENOUS | Status: AC | PRN
  Administered 2020-01-28: 100 mL via INTRAVENOUS

## 2020-01-28 MED ORDER — PANTOPRAZOLE SODIUM 40 MG PO TBEC
40.0000 mg | DELAYED_RELEASE_TABLET | Freq: Every day | ORAL | 0 refills | Status: DC
Start: 2020-01-29 — End: 2020-07-12

## 2020-01-28 NOTE — Progress Notes (Signed)
Patient D/C home with family, out patient PT, Neuro follow up, family denied walker they asked for, did not want to wait, there were no recommendation for equipment.

## 2020-01-28 NOTE — Progress Notes (Signed)
STROKE TEAM PROGRESS NOTE   INTERVAL HISTORY Daughter at bedside.  Patient sitting in chair, had worked with PT/OT, recommend outpatient PT/OT.  He has no other complaints, possible discharge today.  OBJECTIVE Vitals:   01/27/20 2312 01/28/20 0337 01/28/20 0759 01/28/20 1321  BP: (!) 132/95 (!) 160/82 (!) 143/80 129/71  Pulse: 67 65 72 89  Resp: 19 19 18 18   Temp: 98 F (36.7 C) 98.9 F (37.2 C) 98.7 F (37.1 C) 98.8 F (37.1 C)  TempSrc: Oral Axillary Oral Oral  SpO2: 96% 98% 97% 98%  Weight:      Height:        CBC:  Recent Labs  Lab 01/26/20 0900 01/26/20 0900 01/26/20 0909 01/28/20 0552  WBC 5.2  --   --  5.4  NEUTROABS 3.1  --   --   --   HGB 15.0   < > 15.3 14.4  HCT 44.7   < > 45.0 43.1  MCV 92.2  --   --  92.9  PLT 262  --   --  238   < > = values in this interval not displayed.    Basic Metabolic Panel:  Recent Labs  Lab 01/26/20 0900 01/26/20 0900 01/26/20 0909 01/28/20 0552  NA 139   < > 139 135  K 4.2   < > 4.0 3.9  CL 105   < > 106 104  CO2 25  --   --  22  GLUCOSE 86   < > 83 103*  BUN 14   < > 15 10  CREATININE 1.25*   < > 1.00 1.06  CALCIUM 9.1  --   --  8.8*   < > = values in this interval not displayed.    Lipid Panel:     Component Value Date/Time   CHOL 137 01/28/2020 0552   TRIG 56 01/28/2020 0552   HDL 61 01/28/2020 0552   CHOLHDL 2.2 01/28/2020 0552   VLDL 11 01/28/2020 0552   LDLCALC 65 01/28/2020 0552   HgbA1c:  Lab Results  Component Value Date   HGBA1C 6.0 (H) 01/28/2020   Urine Drug Screen:     Component Value Date/Time   LABOPIA NONE DETECTED 01/26/2020 1302   COCAINSCRNUR NONE DETECTED 01/26/2020 1302   LABBENZ NONE DETECTED 01/26/2020 1302   AMPHETMU NONE DETECTED 01/26/2020 1302   THCU POSITIVE (A) 01/26/2020 1302   LABBARB NONE DETECTED 01/26/2020 1302    Alcohol Level     Component Value Date/Time   ETH <10 01/26/2020 0900    IMAGING  CT HEAD WO CONTRAST 01/27/2020 IMPRESSION:  Unchanged acute  hematoma at the right basal ganglia.   CT HEAD CODE STROKE WO CONTRAST 01/26/2020 IMPRESSION:  1. Right internal capsule acute hemorrhage. 4.2 mL of blood. Probable hypertensive hemorrhage. No underlying mass identified. Ventricle size normal.  2. Chronic microvascular ischemic changes in the white matter.   ECG - SR rate 65 BPM. (See cardiology reading for complete details)   PHYSICAL EXAM  Temp:  [98 F (36.7 C)-98.9 F (37.2 C)] 98.8 F (37.1 C) (10/02 1321) Pulse Rate:  [65-89] 89 (10/02 1321) Resp:  [18-19] 18 (10/02 1321) BP: (121-160)/(71-95) 129/71 (10/02 1321) SpO2:  [96 %-98 %] 98 % (10/02 1321)  General - Well nourished, well developed, in no apparent distress.  Ophthalmologic - fundi not visualized due to noncooperation.  Cardiovascular - Regular rhythm and rate.  Mental Status -  Level of arousal and orientation to time, place, and  person were intact. Language including expression, naming, repetition, comprehension was assessed and found intact. Fund of Knowledge was assessed and was intact.  Cranial Nerves II - XII - II - Visual field intact OU. III, IV, VI - Extraocular movements intact. V - Facial sensation intact bilaterally. VII - mild left nasolabial fold flattening. VIII - Hearing & vestibular intact bilaterally. X - Palate elevates symmetrically. XI - Chin turning & shoulder shrug intact bilaterally. XII - Tongue protrusion intact.  Motor Strength - The patient's strength was normal in right upper and lower extremities, however, left upper extremity 4+/5 with pronator drift and left lower extremity 4+/5.  Bulk was normal and fasciculations were absent.   Motor Tone - Muscle tone was assessed at the neck and appendages and was normal.  Reflexes - The patient's reflexes were symmetrical in all extremities and he had no pathological reflexes.  Sensory - Light touch, temperature/pinprick were assessed and were symmetrical.    Coordination - The  patient had normal movements in the hands and feet with no ataxia or dysmetria.  Tremor was absent.  Gait and Station - normal stance, stride, but mild left hemiparetic gait without tendency to fall.   ASSESSMENT/PLAN [name] is a 69 y.o. male with a history of tobacco use who presented with generalized weakness -> left-sided facial droop along with left-sided weakness in the arm and leg ; BP 180/90 . CT revealed a right internal capsule intracranial hemorrhage.  He did not receive IV t-PA due to ICH.  ICH: Right internal capsule acute hemorrhage, likely due to hypertension  Resultant mild left hemiparesis  CT Head - Right internal capsule acute hemorrhage  CT head - Unchanged acute hematoma at the right basal ganglia.   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 On amlodipine 5 . Long-term BP goal normotensive  Tobacco abuse  Current smoker  Smoking cessation counseling provided  Pt is willing to quit  Other Stroke Risk Factors  Advanced age  UDS positive for THCU - cessation education provided  Other Active Problems  Code status - Full code  AKI, resolved - creatinine - 1.25->1.00   Hospital day # 2  Neurology will sign off. Please call with questions. Pt will follow up with stroke clinic NP at [facility] in about 4 weeks. Thanks for the consult.  Marvel Plan, MD PhD Stroke Neurology 01/28/2020 3:53 PM   To contact Stroke Continuity provider, please refer to [email or web address]. After hours, contact General  Neurology

## 2020-01-28 NOTE — Plan of Care (Signed)
Adequate for discharge.

## 2020-01-28 NOTE — TOC Transition Note (Signed)
Transition of Care [facility]) - CM/SW Discharge Note   Patient Details  Name: Paul Trevino MRN: [id number] Date of Birth: 09-06-1950  [facility] CM/SW Contact:  Lawerance Sabal, RN Phone Number: 01/28/2020, 2:27 PM   Clinical Narrative:   Sherron Monday w patient's daughter to discuss DC needs. Recs from PT are outpatient, no DME, recs from OT are no follow up, no DME. Explained recs to daughter, she requested RW. Explained that I could order that for them, and that delivery to the room could take 2 hours. She declined waiting for DME, so RW not ordered. Choice offered for outpatient PT, and referral placed to [facility]. Added to AVS, requested nurse to reprint AVS.     Final next level of care: Home/Self Care Barriers to Discharge: No Barriers Identified   Patient Goals and CMS Choice        Discharge Placement                       Discharge Plan and Services                                     Social Determinants of Health (SDOH) Interventions     Readmission Risk Interventions No flowsheet data found.

## 2020-01-28 NOTE — Evaluation (Signed)
Occupational Therapy Evaluation Patient Details Name: Paul Trevino MRN: [id number] DOB: 05-Sep-1950 Today's Date: 01/28/2020    History of Present Illness 69 yo male admitted to [facility] on 9/30 with L facial droop and L weakness, Imaging reveals R internal capsule ICH. PMH includes smoking.   Clinical Impression   Patient evaluated by Occupational Therapy with no further acute OT needs identified. All education has been completed and the patient has no further questions. Pt is mod I with ADLs.  He does demonstrate mild balance deficits, as well as mild Lt UE weakness which I anticipate will resolve without formal OT.   No obvious cognitive deficits noted during functional activities.   See below for any follow-up Occupational Therapy or equipment needs. OT is signing off. Thank you for this referral.      Follow Up Recommendations  No OT follow up    Equipment Recommendations  None recommended by OT    Recommendations for Other Services       Precautions / Restrictions Precautions Precautions: Fall Restrictions Weight Bearing Restrictions: No      Mobility Bed Mobility Overal bed mobility: Independent                Transfers Overall transfer level: Modified independent                    Balance Overall balance assessment: Mild deficits observed, not formally tested                                         ADL either performed or assessed with clinical judgement   ADL Overall ADL's : Modified independent                                       General ADL Comments: Pt able to simulate showering without LOB      Vision Baseline Vision/History: No visual deficits Patient Visual Report: No change from baseline Vision Assessment?: Yes Eye Alignment: Within Functional Limits Ocular Range of Motion: Within Functional Limits Alignment/Gaze Preference: Within Defined Limits Tracking/Visual Pursuits: Able to track stimulus in  all quads without difficulty Saccades: Within functional limits Visual Fields: No apparent deficits     Development worker, international aid Tested?: Yes   Praxis Praxis Praxis tested?: Within functional limits    Pertinent Vitals/Pain Pain Assessment: No/denies pain     Hand Dominance Right   Extremity/Trunk Assessment Upper Extremity Assessment Upper Extremity Assessment: LUE deficits/detail LUE Deficits / Details: grossly 4/5, but mildly  LUE Sensation: decreased proprioception LUE Coordination: decreased gross motor;decreased fine motor   Lower Extremity Assessment Lower Extremity Assessment: Defer to PT evaluation   Cervical / Trunk Assessment Cervical / Trunk Assessment: Normal   Communication Communication Communication: No difficulties   Cognition Arousal/Alertness: Awake/alert Behavior During Therapy: WFL for tasks assessed/performed Overall Cognitive Status: Within Functional Limits for tasks assessed                                 General Comments: Pt able to follow 4 step command, perform path finding using signs without difficulty, and was able to find his way back to his room by memory.  while finding his way back to his room, the Short blessed Test was administered  with pt scoring 0/28 (no errors)    General Comments       Exercises     Shoulder Instructions      Home Living Family/patient expects to be discharged to:: Private residence Living Arrangements: Parent;Other relatives (mother and sister ) Available Help at Discharge: Family;Available 24 hours/day Type of Home: House Home Access: Stairs to enter [employer] of Steps: 5 Entrance Stairs-Rails: Can reach both Home Layout: One level     Bathroom Shower/Tub: Chief Strategy Officer: Standard     Home Equipment: Environmental consultant - 4 wheels;Walker - 2 wheels          Prior Functioning/Environment Level of Independence: Independent        Comments: Pt was  a Production designer, 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  Fully independent         OT Problem List: Decreased strength;Impaired balance (sitting and/or standing)      OT Treatment/Interventions:      OT Goals(Current goals can be found in the care plan section) Acute Rehab OT Goals Patient Stated Goal: to go home today  OT Goal Formulation: All assessment and education complete, DC therapy  OT Frequency:     Barriers to D/C:            Co-evaluation              AM-PAC OT "6 Clicks" Daily Activity     Outcome Measure Help from another person eating meals?: None Help from another person taking care of personal grooming?: None Help from another person toileting, which includes using toliet, bedpan, or urinal?: None Help from another person bathing (including washing, rinsing, drying)?: None Help from another person to put on and taking off regular upper body clothing?: None Help from another person to put on and taking off regular lower body clothing?: None 6 Click Score: 24   End of Session    Activity Tolerance: Patient tolerated treatment well Patient left: in bed;with call bell/phone within reach  OT Visit Diagnosis: Muscle weakness (generalized) (M62.81)                Time: 5053-9767 OT Time Calculation (min): 17 min Charges:  OT General Charges $OT Visit: 1 Visit OT Evaluation $OT Eval Low Complexity: 1 Low  Eber Jones., OTR/L Acute Rehabilitation Services Pager [phone] Office [phone]   Jeani Hawking M 01/28/2020, 2:00 PM

## 2020-01-28 NOTE — Discharge Summary (Signed)
Physician Discharge Summary  Corydon Schweiss ZOX:[id number] DOB: 02/12/1951 DOA: 01/26/2020  PCP: Patient, No Pcp Per  Admit date: 01/26/2020 Discharge date: 01/28/2020  Admitted From: Home Disposition: Home  Recommendations for Outpatient Follow-up:  1. Follow up with PCP in 1-2 weeks 2. Please obtain BMP/CBC in one week 3. Please follow up on the following pending results:  Home Health: Outpatient PT Equipment/Devices: Rolling walker  Discharge Condition: Stable CODE STATUS: Full Diet recommendation: Low-fat diet as tolerated  Brief/Interim Summary: Kaulder Spinksis a 69 y.o.malewith no past medical history. Patient was at work when at 740 he noticed that he had generalized weakness. Coworkers noted that he had left-sided facial droop along with left-sided weakness in the arm and leg. EMS was called. Patient's blood pressure was 180/90 in route. Upon arriving to [facility] it was noted patient did have the left facial droop along with left-sided weakness. Patient states that he does take aspirin however he does not take it on a regular basis. Patient was immediately brought back to CT which revealed a right internal capsule intracranial hemorrhage. Thus no TPA was given. Patient's blood pressure was lowered with labetalol and if needed Cleviprex. CT head shows-obtained and showed a right internal capsule hemorrhage.  Patient met as above with generalized weakness left facial droop left arm and leg weakness noted to have right internal capsule intracranial hemorrhage on admission, evaluated by neurology with repeat imaging and work-up.  Continued evaluation PT indicates patient is now improving drastically and recommends outpatient physical therapy with a rolling walker.  Blood pressure is currently well controlled on new medications including amlodipine.  At this time patient is otherwise stable and agreeable for discharge home, close follow-up with PCP, neurology, outpatient physical  therapy as discussed.  Given small intracranial hemorrhage hold off on dual antiplatelet therapy and statin per neurology recommendations.  Once outside of the more acute window would consider further discussion about statin given his lipid panel was remarkably normal.  Discharge Diagnoses:  Active Problems:   ICH (intracerebral hemorrhage) [facility])    Discharge Instructions  Discharge Instructions    Ambulatory referral to Neurology   Complete by: As directed    An appointment is requested in approximately: 6 - 8 weeks   Call MD for:   Complete by: As directed    Elevated blood pressure; greater than 160/100   Call MD for:  persistant dizziness or light-headedness   Complete by: As directed    Diet - low sodium heart healthy   Complete by: As directed    Increase activity slowly   Complete by: As directed    Increase activity slowly   Complete by: As directed      Allergies as of 01/28/2020   No Known Allergies     Medication List    STOP taking these medications   aspirin EC 81 MG tablet     TAKE these medications   amLODipine 5 MG tablet Commonly known as: NORVASC Take 1 tablet (5 mg total) by mouth daily. Start taking on: January 29, 2020   pantoprazole 40 MG tablet Commonly known as: PROTONIX Take 1 tablet (40 mg total) by mouth daily. Start taking on: January 29, 2020            Durable Medical Equipment  (From admission, onward)         Start     Ordered   01/28/20 1421  DME Walker  Once       Question Answer Comment  Walker: With 5 Inch Wheels   Patient needs a walker to treat with the following condition Ambulatory dysfunction      01/28/20 1422          Follow-up Information    Garvin Fila, MD Follow up in 8 week(s).   Specialties: Neurology, Radiology Why: The office will call you for a follow-up appointment.  Contact information: [address] [place] [address] [phone]              No Known  Allergies  Consultations:  Neurology Dr. Leonie Man   Procedures/Studies: CT ANGIO HEAD W OR WO CONTRAST  Result Date: 01/28/2020 CLINICAL DATA:  Stroke follow-up EXAM: CT ANGIOGRAPHY HEAD AND NECK TECHNIQUE: Multidetector CT imaging of the head and neck was performed using the standard protocol during bolus administration of intravenous contrast. Multiplanar CT image reconstructions and MIPs were obtained to evaluate the vascular anatomy. Carotid stenosis measurements (when applicable) are obtained utilizing NASCET criteria, using the distal internal carotid diameter as the denominator. CONTRAST:  149mL OMNIPAQUE IOHEXOL 350 MG/ML SOLN COMPARISON:  None. FINDINGS: CT HEAD FINDINGS Brain: Unchanged size of right basal ganglia intraparenchymal hematoma. No new site of hemorrhage. Surrounding edema is unchanged. The size and configuration of the ventricles and extra-axial CSF spaces are normal. There is no acute or chronic infarction. The brain parenchyma is normal. Skull: The visualized skull base, calvarium and extracranial soft tissues are normal. Sinuses/Orbits: Mild bilateral maxillary sinus mucosal thickening. The orbits are normal. CTA NECK FINDINGS SKELETON: There is no bony spinal canal stenosis. No lytic or blastic lesion. OTHER NECK: Normal pharynx, larynx and major salivary glands. No cervical lymphadenopathy. Unremarkable thyroid gland. UPPER CHEST: Emphysema AORTIC ARCH: There is mild calcific atherosclerosis of the aortic arch. There is no aneurysm, dissection or hemodynamically significant stenosis of the visualized portion of the aorta. Conventional 3 vessel aortic branching pattern. 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 RIGHT CAROTID SYSTEM: No dissection, occlusion or aneurysm. There is mixed density atherosclerosis extending into the proximal ICA, resulting in less than 50% stenosis. LEFT CAROTID SYSTEM: No dissection, occlusion or  aneurysm. There is mixed density atherosclerosis extending into the proximal ICA, resulting in less than 50% stenosis. VERTEBRAL ARTERIES: Right dominant configuration. Both origins are clearly patent. There is no dissection, occlusion or flow-limiting stenosis to the skull base (V1-V3 segments). CTA HEAD FINDINGS POSTERIOR CIRCULATION: --Vertebral arteries: Normal V4 segments. --Inferior cerebellar arteries: Normal. --Basilar artery: Normal. --Superior cerebellar arteries: Normal. --Posterior cerebral arteries (PCA): Normal. ANTERIOR CIRCULATION: --Intracranial internal carotid arteries: Normal. --Anterior cerebral arteries (ACA): Normal. Both A1 segments are present. Patent anterior communicating artery (a-comm). --Middle cerebral arteries (MCA): Normal. VENOUS SINUSES: As permitted by contrast timing, patent. ANATOMIC VARIANTS: Fetal origin of the right posterior cerebral artery. Review of the MIP images confirms the above findings. IMPRESSION: 1. No emergent large vessel occlusion or high-grade stenosis of the intracranial arteries. 2. Unchanged size of right basal ganglia hypertensive hemorrhage with surrounding edema. No aneurysm or arteriovenous malformation. 3. Bilateral carotid bifurcation atherosclerosis with less than 50 % stenosis. 4. 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 Aortic Atherosclerosis (ICD10-I70.0) and Emphysema (ICD10-J43.9). Electronically Signed   By: Ulyses Jarred M.D.   On: 01/28/2020 03:31   CT HEAD WO CONTRAST  Result Date: 01/27/2020 CLINICAL DATA:  Follow-up cerebral hemorrhage EXAM: CT HEAD WITHOUT CONTRAST TECHNIQUE: Contiguous axial images were obtained from the base of  the skull through the vertex without intravenous contrast. COMPARISON:  Yesterday FINDINGS: Brain: 2.7 x 2.7 x 1.3 cm acute hematoma centered at the posterior right putamen and adjacent white matter which is not convincingly changed when remeasured in a  similar fashion. No subarachnoid or intraventricular extension. No worrisome swelling. No new site of hemorrhage when accounting for left globus pallidus mineralization. Mild, presumed small vessel ischemic type change in the cerebral white matter. Negative for cortex infarct or masslike finding Vascular: No hyperdense vessel or unexpected calcification. Skull: Normal. Negative for fracture or focal lesion. Sinuses/Orbits: No acute finding. IMPRESSION: Unchanged acute hematoma at the right basal ganglia. Electronically Signed   By: Monte Fantasia M.D.   On: 01/27/2020 05:14   CT ANGIO NECK W OR WO CONTRAST  Result Date: 01/28/2020 CLINICAL DATA:  Stroke follow-up EXAM: CT ANGIOGRAPHY HEAD AND NECK TECHNIQUE: Multidetector CT imaging of the head and neck was performed using the standard protocol during bolus administration of intravenous contrast. Multiplanar CT image reconstructions and MIPs were obtained to evaluate the vascular anatomy. Carotid stenosis measurements (when applicable) are obtained utilizing NASCET criteria, using the distal internal carotid diameter as the denominator. CONTRAST:  159mL OMNIPAQUE IOHEXOL 350 MG/ML SOLN COMPARISON:  None. FINDINGS: CT HEAD FINDINGS Brain: Unchanged size of right basal ganglia intraparenchymal hematoma. No new site of hemorrhage. Surrounding edema is unchanged. The size and configuration of the ventricles and extra-axial CSF spaces are normal. There is no acute or chronic infarction. The brain parenchyma is normal. Skull: The visualized skull base, calvarium and extracranial soft tissues are normal. Sinuses/Orbits: Mild bilateral maxillary sinus mucosal thickening. The orbits are normal. CTA NECK FINDINGS SKELETON: There is no bony spinal canal stenosis. No lytic or blastic lesion. OTHER NECK: Normal pharynx, larynx and major salivary glands. No cervical lymphadenopathy. Unremarkable thyroid gland. UPPER CHEST: Emphysema AORTIC ARCH: There is mild calcific  atherosclerosis of the aortic arch. There is no aneurysm, dissection or hemodynamically significant stenosis of the visualized portion of the aorta. Conventional 3 vessel aortic branching pattern. 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 RIGHT CAROTID SYSTEM: No dissection, occlusion or aneurysm. There is mixed density atherosclerosis extending into the proximal ICA, resulting in less than 50% stenosis. LEFT CAROTID SYSTEM: No dissection, occlusion or aneurysm. There is mixed density atherosclerosis extending into the proximal ICA, resulting in less than 50% stenosis. VERTEBRAL ARTERIES: Right dominant configuration. Both origins are clearly patent. There is no dissection, occlusion or flow-limiting stenosis to the skull base (V1-V3 segments). CTA HEAD FINDINGS POSTERIOR CIRCULATION: --Vertebral arteries: Normal V4 segments. --Inferior cerebellar arteries: Normal. --Basilar artery: Normal. --Superior cerebellar arteries: Normal. --Posterior cerebral arteries (PCA): Normal. ANTERIOR CIRCULATION: --Intracranial internal carotid arteries: Normal. --Anterior cerebral arteries (ACA): Normal. Both A1 segments are present. Patent anterior communicating artery (a-comm). --Middle cerebral arteries (MCA): Normal. VENOUS SINUSES: As permitted by contrast timing, patent. ANATOMIC VARIANTS: Fetal origin of the right posterior cerebral artery. Review of the MIP images confirms the above findings. IMPRESSION: 1. No emergent large vessel occlusion or high-grade stenosis of the intracranial arteries. 2. Unchanged size of right basal ganglia hypertensive hemorrhage with surrounding edema. No aneurysm or arteriovenous malformation. 3. Bilateral carotid bifurcation atherosclerosis with less than 50 % stenosis. 4. 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 Aortic Atherosclerosis (ICD10-I70.0) and Emphysema (ICD10-J43.9).  Electronically Signed   By: Ulyses Jarred M.D.   On: 01/28/2020 03:31   MR BRAIN WO CONTRAST  Result Date: 01/27/2020 CLINICAL DATA:  Stroke follow-up. EXAM: MRI HEAD WITHOUT CONTRAST TECHNIQUE: Multiplanar, multiecho pulse sequences of the brain and surrounding structures were obtained without intravenous contrast. COMPARISON:  Head CTs from yesterday and earlier today FINDINGS: Brain: Known acute hematoma at the right posterior putamen and adjacent white matter tracks, dimensions better assessed by preceding CT. No underlying infarct is seen. Mild periventricular chronic small vessel ischemia. Normal brain volume. No collection, hydrocephalus, or gross masslike features. There is an expected thin rim of edema around the hematoma. Vascular: Normal flow voids. Skull and upper cervical spine: Normal marrow signal Sinuses/Orbits: Mild patchy opacification of paranasal sinuses. Other: Progressively motion degraded study IMPRESSION: 1. Known acute hematoma at the right putamen and internal capsule. No underlying infarct. 2. Mild periventricular chronic small vessel ischemia. Electronically Signed   By: Monte Fantasia M.D.   On: 01/27/2020 05:56   ECHOCARDIOGRAM COMPLETE  Result Date: 01/27/2020    ECHOCARDIOGRAM REPORT   Patient Name:   NOLE ROBEY Date of Exam: 01/27/2020 Medical Rec #:  [id number]    Height:       70.0 in Accession #:    [id number]   Weight:       133.2 lb Date of Birth:  06/01/1950    BSA:          1.756 m Patient Age:    69 years     BP:           119/72 mmHg Patient Gender: M            HR:           80 bpm. Exam Location:  Inpatient Procedure: 2D Echo Indications:    Stroke I163.9  History:        Patient has no prior history of Echocardiogram examinations.                 Risk Factors:Current Smoker.  Sonographer:    Mikki Santee RDCS (AE) Referring Phys: [place]  1. Left ventricular ejection fraction, by estimation, is 60 to 65%. The left ventricle has normal  function. The left ventricle has no regional wall motion abnormalities. There is mild left ventricular hypertrophy. Left ventricular diastolic parameters are consistent with Grade I diastolic dysfunction (impaired relaxation).  2. Right ventricular systolic function is normal. The right ventricular size is normal.  3. The mitral valve is normal in structure. No evidence of mitral valve regurgitation. No evidence of mitral stenosis.  4. The aortic valve is tricuspid. Aortic valve regurgitation is not visualized. Mild aortic valve sclerosis is present, with no evidence of aortic valve stenosis.  5. The inferior vena cava is normal in size with greater than 50% respiratory variability, suggesting right atrial pressure of 3 mmHg. FINDINGS  Left Ventricle: Left ventricular ejection fraction, by estimation, is 60 to 65%. The left ventricle has normal function. The left ventricle has no regional wall motion abnormalities. The left ventricular internal cavity size was normal in size. There is  mild left ventricular hypertrophy. Left ventricular diastolic parameters are consistent with Grade I diastolic dysfunction (impaired relaxation). Right Ventricle: The right ventricular size is normal.Right ventricular systolic function is normal. Left Atrium: Left atrial size was normal in size. Right Atrium: Right atrial size was normal in size. Pericardium: There is no evidence of pericardial effusion. Mitral Valve: The mitral valve is normal in structure. No evidence of mitral valve regurgitation. No evidence of mitral valve stenosis. Tricuspid Valve: The tricuspid valve  is normal in structure. Tricuspid valve regurgitation is trivial. No evidence of tricuspid stenosis. Aortic Valve: The aortic valve is tricuspid. Aortic valve regurgitation is not visualized. Mild aortic valve sclerosis is present, with no evidence of aortic valve stenosis. Pulmonic Valve: The pulmonic valve was normal in structure. Pulmonic valve regurgitation is  not visualized. No evidence of pulmonic stenosis. Aorta: The aortic root is normal in size and structure. Venous: The inferior vena cava is normal in size with greater than 50% respiratory variability, suggesting right atrial pressure of 3 mmHg. IAS/Shunts: No atrial level shunt detected by color flow Doppler.  LEFT VENTRICLE PLAX 2D LVIDd:         3.60 cm  Diastology LVIDs:         2.20 cm  LV e' medial:    5.64 cm/s LV PW:         1.20 cm  LV E/e' medial:  10.8 LV IVS:        1.20 cm  LV e' lateral:   6.86 cm/s LVOT diam:     2.20 cm  LV E/e' lateral: 8.9 LV SV:         97 LV SV Index:   55 LVOT Area:     3.80 cm  RIGHT VENTRICLE RV S prime:     12.80 cm/s TAPSE (M-mode): 2.3 cm LEFT ATRIUM             Index       RIGHT ATRIUM          Index LA diam:        2.10 cm 1.20 cm/m  RA Area:     8.41 cm LA Vol (A2C):   37.8 ml 21.53 ml/m RA Volume:   14.10 ml 8.03 ml/m LA Vol (A4C):   21.6 ml 12.30 ml/m LA Biplane Vol: 29.0 ml 16.52 ml/m  AORTIC VALVE LVOT Vmax:   119.00 cm/s LVOT Vmean:  72.600 cm/s LVOT VTI:    0.255 m  AORTA Ao Root diam: 3.10 cm MITRAL VALVE MV Area (PHT): 1.83 cm    SHUNTS MV Decel Time: 415 msec    Systemic VTI:  0.26 m MV E velocity: 60.80 cm/s  Systemic Diam: 2.20 cm MV A velocity: 66.00 cm/s MV E/A ratio:  0.92 Olga Millers MD Electronically signed by Olga Millers MD Signature Date/Time: 01/27/2020/12:55:57 PM    Final    CT HEAD CODE STROKE WO CONTRAST  Result Date: 01/26/2020 CLINICAL DATA:  Code stroke. Acute neuro deficit. Unsteady gait slurred speech EXAM: CT HEAD WITHOUT CONTRAST TECHNIQUE: Contiguous axial images were obtained from the base of the skull through the vertex without intravenous contrast. COMPARISON:  None. FINDINGS: Brain: Acute hemorrhage in the right lateral basal ganglia likely the external capsule. Hemorrhage measures 2.5 x 1.4 x 2.4 cm, approximately 4.2 mL of blood. No underlying mass or infarct identified Ventricle size normal. Patchy white matter  hypodensity bilaterally most likely chronic microvascular ischemia. Vascular: Negative for hyperdense vessel Skull: Negative Sinuses/Orbits: Mild mucosal edema paranasal sinuses. Negative orbit Other: None ASPECTS ([place]berta Stroke Program Early CT Score) Not applicable due to intracranial hemorrhage IMPRESSION: 1. Right internal capsule acute hemorrhage. 4.2 mL of blood. Probable hypertensive hemorrhage. No underlying mass identified. Ventricle size normal. 2. Chronic microvascular ischemic changes in the white matter. 3. These results were called by telephone at the time of interpretation on 01/26/2020 at 9:19 am to provider Thomasena Edis, who verbally acknowledged these results. Electronically Signed   By: Marlan Palau  M.D.   On: 01/26/2020 09:20     Subjective: No acute issues or events overnight   Discharge Exam: Vitals:   01/28/20 0759 01/28/20 1321  BP: (!) 143/80 129/71  Pulse: 72 89  Resp: 18 18  Temp: 98.7 F (37.1 C) 98.8 F (37.1 C)  SpO2: 97% 98%   Vitals:   01/27/20 2312 01/28/20 0337 01/28/20 0759 01/28/20 1321  BP: (!) 132/95 (!) 160/82 (!) 143/80 129/71  Pulse: 67 65 72 89  Resp: $Remo'19 19 18 18  'onaSQ$ Temp: 98 F (36.7 C) 98.9 F (37.2 C) 98.7 F (37.1 C) 98.8 F (37.1 C)  TempSrc: Oral Axillary Oral Oral  SpO2: 96% 98% 97% 98%  Weight:      Height:        General: Pt is alert, awake, not in acute distress Cardiovascular: RRR, S1/S2 +, no rubs, no gallops Respiratory: CTA bilaterally, no wheezing, no rhonchi Abdominal: Soft, NT, ND, bowel sounds + Extremities: no edema, no cyanosis    The results of significant diagnostics from this hospitalization (including imaging, microbiology, ancillary and laboratory) are listed below for reference.     Microbiology: Recent Results (from the past 240 hour(s))  Respiratory Panel by RT PCR (Flu A&B, Covid) - Nasopharyngeal Swab     Status: None   Collection Time: 01/26/20  9:19 AM   Specimen: Nasopharyngeal Swab  Result Value  Ref Range Status   SARS Coronavirus 2 by RT PCR NEGATIVE NEGATIVE Final    Comment: (NOTE) SARS-CoV-2 target nucleic acids are NOT DETECTED.  The SARS-CoV-2 RNA is generally detectable in upper respiratoy specimens during the acute phase of infection. The lowest concentration of SARS-CoV-2 viral copies this assay can detect is 131 copies/mL. A negative result does not preclude SARS-Cov-2 infection and should not be used as the sole basis for treatment or other patient management decisions. A negative result may occur with  improper specimen collection/handling, submission of specimen other than nasopharyngeal swab, presence of viral mutation(s) within the areas targeted by this assay, and inadequate number of viral copies (<131 copies/mL). A negative result must be combined with clinical observations, patient history, and epidemiological information. The expected result is Negative.  Fact Sheet for Patients:  PinkCheek.be  Fact Sheet for Healthcare Providers:  GravelBags.it  This test is no t yet approved or cleared by the [place] FDA and  has been authorized for detection and/or diagnosis of SARS-CoV-2 by FDA under an Emergency Use Authorization (EUA). This EUA will remain  in effect (meaning this test can be used) for the duration of the COVID-19 declaration under Section 564(b)(1) of the Act, 21 U.S.C. section 360bbb-3(b)(1), unless the authorization is terminated or revoked sooner.     Influenza A by PCR NEGATIVE NEGATIVE Final   Influenza B by PCR NEGATIVE NEGATIVE Final    Comment: (NOTE) The Xpert Xpress SARS-CoV-2/FLU/RSV assay is intended as an aid in  the diagnosis of influenza from Nasopharyngeal swab specimens and  should not be used as a sole basis for treatment. Nasal washings and  aspirates are unacceptable for Xpert Xpress SARS-CoV-2/FLU/RSV  testing.  Fact Sheet for  Patients: PinkCheek.be  Fact Sheet for Healthcare Providers: GravelBags.it  This test is not yet approved or cleared by the [place] FDA and  has been authorized for detection and/or diagnosis of SARS-CoV-2 by  FDA under an Emergency Use Authorization (EUA). This EUA will remain  in effect (meaning this test can be used) for the duration of the  Covid-19  declaration under Section 564(b)(1) of the Act, 21  U.S.C. section 360bbb-3(b)(1), unless the authorization is  terminated or revoked. Performed at [facility], [place] [address] [place], [place] [address]   MRSA PCR Screening     Status: None   Collection Time: 01/26/20  2:19 PM   Specimen: Nasopharyngeal  Result Value Ref Range Status   MRSA by PCR NEGATIVE NEGATIVE Final    Comment:        The GeneXpert MRSA Assay (FDA approved for NASAL specimens only), is one component of a comprehensive MRSA colonization surveillance program. It is not intended to diagnose MRSA infection nor to guide or monitor treatment for MRSA infections. Performed at [facility], [place] [address] [place], [place] [address]      Labs: BNP (last 3 results) No results for input(s): BNP in the last 8760 hours. Basic Metabolic Panel: Recent Labs  Lab 01/26/20 0900 01/26/20 0909 01/28/20 0552  NA 139 139 135  K 4.2 4.0 3.9  CL 105 106 104  CO2 25  --  22  GLUCOSE 86 83 103*  BUN $Re'14 15 10  'VhH$ CREATININE 1.25* 1.00 1.06  CALCIUM 9.1  --  8.8*   Liver Function Tests: Recent Labs  Lab 01/26/20 0900  AST 35  ALT 27  ALKPHOS 65  BILITOT 0.8  PROT 6.9  ALBUMIN 3.7   No results for input(s): LIPASE, AMYLASE in the last 168 hours. No results for input(s): AMMONIA in the last 168 hours. CBC: Recent Labs  Lab 01/26/20 0900 01/26/20 0909 01/28/20 0552  WBC 5.2  --  5.4  NEUTROABS 3.1  --   --   HGB 15.0 15.3 14.4  HCT 44.7 45.0 43.1  MCV 92.2  --  92.9  PLT 262   --  238   Cardiac Enzymes: No results for input(s): CKTOTAL, CKMB, CKMBINDEX, TROPONINI in the last 168 hours. BNP: Invalid input(s): POCBNP CBG: No results for input(s): GLUCAP in the last 168 hours. D-Dimer No results for input(s): DDIMER in the last 72 hours. Hgb A1c Recent Labs    01/28/20 0552  HGBA1C 6.0*   Lipid Profile Recent Labs    01/27/20 0949 01/28/20 0552  CHOL  --  137  HDL  --  61  LDLCALC  --  65  TRIG 78 56  CHOLHDL  --  2.2   Thyroid function studies No results for input(s): TSH, T4TOTAL, T3FREE, THYROIDAB in the last 72 hours.  Invalid input(s): FREET3 Anemia work up No results for input(s): VITAMINB12, FOLATE, FERRITIN, TIBC, IRON, RETICCTPCT in the last 72 hours. Urinalysis    Component Value Date/Time   COLORURINE STRAW (A) 01/26/2020 1302   APPEARANCEUR CLEAR 01/26/2020 1302   LABSPEC 1.009 01/26/2020 1302   PHURINE 6.0 01/26/2020 1302   GLUCOSEU NEGATIVE 01/26/2020 1302   HGBUR NEGATIVE 01/26/2020 1302   BILIRUBINUR NEGATIVE 01/26/2020 1302   KETONESUR NEGATIVE 01/26/2020 1302   PROTEINUR NEGATIVE 01/26/2020 1302   NITRITE NEGATIVE 01/26/2020 1302   LEUKOCYTESUR NEGATIVE 01/26/2020 1302   Sepsis Labs Invalid input(s): PROCALCITONIN,  WBC,  LACTICIDVEN Microbiology Recent Results (from the past 240 hour(s))  Respiratory Panel by RT PCR (Flu A&B, Covid) - Nasopharyngeal Swab     Status: None   Collection Time: 01/26/20  9:19 AM   Specimen: Nasopharyngeal Swab  Result Value Ref Range Status   SARS Coronavirus 2 by RT PCR NEGATIVE NEGATIVE Final    Comment: (NOTE) SARS-CoV-2 target nucleic acids are NOT  DETECTED.  The SARS-CoV-2 RNA is generally detectable in upper respiratoy specimens during the acute phase of infection. The lowest concentration of SARS-CoV-2 viral copies this assay can detect is 131 copies/mL. A negative result does not preclude SARS-Cov-2 infection and should not be used as the sole basis for treatment or other  patient management decisions. A negative result may occur with  improper specimen collection/handling, submission of specimen other than nasopharyngeal swab, presence of viral mutation(s) within the areas targeted by this assay, and inadequate number of viral copies (<131 copies/mL). A negative result must be combined with clinical observations, patient history, and epidemiological information. The expected result is Negative.  Fact Sheet for Patients:  P[place]Cheek.be  Fact Sheet for Healthcare Providers:  GravelBags.it  This test is no t yet approved or cleared by the [place] FDA and  has been authorized for detection and/or diagnosis of SARS-CoV-2 by FDA under an Emergency Use Authorization (EUA). This EUA will remain  in effect (meaning this test can be used) for the duration of the COVID-19 declaration under Section 564(b)(1) of the Act, 21 U.S.C. section 360bbb-3(b)(1), unless the authorization is terminated or revoked sooner.     Influenza A by PCR NEGATIVE NEGATIVE Final   Influenza B by PCR NEGATIVE NEGATIVE Final    Comment: (NOTE) The Xpert Xpress SARS-CoV-2/FLU/RSV assay is intended as an aid in  the diagnosis of influenza from Nasopharyngeal swab specimens and  should not be used as a sole basis for treatment. Nasal washings and  aspirates are unacceptable for Xpert Xpress SARS-CoV-2/FLU/RSV  testing.  Fact Sheet for Patients: [place]Cheek.be  Fact Sheet for Healthcare Providers: GravelBags.it  This test is not yet approved or cleared by the [place] FDA and  has been authorized for detection and/or diagnosis of SARS-CoV-2 by  FDA under an Emergency Use Authorization (EUA). This EUA will remain  in effect (meaning this test can be used) for the duration of the  Covid-19 declaration under Section 564(b)(1) of the Act, 21  U.S.C. section  360bbb-3(b)(1), unless the authorization is  terminated or revoked. Performed at [facility], [place] [address], [place], [place] [address]   MRSA PCR Screening     Status: None   Collection Time: 01/26/20  2:19 PM   Specimen: Nasopharyngeal  Result Value Ref Range Status   MRSA by PCR NEGATIVE NEGATIVE Final    Comment:        The GeneXpert MRSA Assay (FDA approved for NASAL specimens only), is one component of a comprehensive MRSA colonization surveillance program. It is not intended to diagnose MRSA infection nor to guide or monitor treatment for MRSA infections. Performed at [facility], [place] [address] [place], [place] [address]      Time coordinating discharge: Over 30 minutes  SIGNED:   Little Ishikawa, DO [facility] 01/28/2020, 2:23 PM Pager   If 7PM-7AM, please contact night-coverage [email or web address]

## 2020-01-29 ENCOUNTER — Emergency Department (HOSPITAL_COMMUNITY)
Admission: EM | Admit: 2020-01-29 | Discharge: 2020-01-30 | Disposition: A | Discharge status: Home / Self Care | Payer: Medicare Other | Attending: Emergency Medicine | Admitting: Emergency Medicine

## 2020-01-29 ENCOUNTER — Other Ambulatory Visit: Payer: Self-pay

## 2020-01-29 DIAGNOSIS — Z79899 Other long term (current) drug therapy: Secondary | ICD-10-CM | POA: Insufficient documentation

## 2020-01-29 DIAGNOSIS — I809 Phlebitis and thrombophlebitis of unspecified site: Secondary | ICD-10-CM | POA: Insufficient documentation

## 2020-01-29 DIAGNOSIS — F17200 Nicotine dependence, unspecified, uncomplicated: Secondary | ICD-10-CM | POA: Insufficient documentation

## 2020-01-29 DIAGNOSIS — R531 Weakness: Secondary | ICD-10-CM | POA: Insufficient documentation

## 2020-01-29 NOTE — ED Triage Notes (Signed)
Was here Thursday and admitted for stroke work up and pt was discharged Saturday. Pt said he feels like he is weak and his balance is off. Pt said he had slurred speech/ Pt walked all the way in to triage and spoke in correct sentences.. Pt grips and strengths are equal

## 2020-01-30 LAB — CBC
HCT: 44 % (ref 39.0–52.0)
Hemoglobin: 15.2 g/dL (ref 13.0–17.0)
MCH: 32 pg (ref 26.0–34.0)
MCHC: 34.5 g/dL (ref 30.0–36.0)
MCV: 92.6 fL (ref 80.0–100.0)
Platelets: 242 10*3/uL (ref 150–400)
RBC: 4.75 MIL/uL (ref 4.22–5.81)
RDW: 13.2 % (ref 11.5–15.5)
WBC: 6.8 10*3/uL (ref 4.0–10.5)
nRBC: 0 % (ref 0.0–0.2)

## 2020-01-30 LAB — URINALYSIS, ROUTINE W REFLEX MICROSCOPIC
Bacteria, UA: NONE SEEN
Bilirubin Urine: NEGATIVE
Glucose, UA: NEGATIVE mg/dL
Hgb urine dipstick: NEGATIVE
Ketones, ur: NEGATIVE mg/dL
Leukocytes,Ua: NEGATIVE
Nitrite: NEGATIVE
Protein, ur: 30 mg/dL — AB
Specific Gravity, Urine: 1.026 (ref 1.005–1.030)
pH: 6 (ref 5.0–8.0)

## 2020-01-30 LAB — BASIC METABOLIC PANEL
Anion gap: 11 (ref 5–15)
BUN: 16 mg/dL (ref 8–23)
CO2: 23 mmol/L (ref 22–32)
Calcium: 9.1 mg/dL (ref 8.9–10.3)
Chloride: 102 mmol/L (ref 98–111)
Creatinine, Ser: 1.29 mg/dL — ABNORMAL HIGH (ref 0.61–1.24)
GFR calc Af Amer: >60 mL/min (ref >60)
GFR calc non Af Amer: 56 mL/min — ABNORMAL LOW (ref >60)
Glucose, Bld: 106 mg/dL — ABNORMAL HIGH (ref 70–99)
Potassium: 4.2 mmol/L (ref 3.5–5.1)
Sodium: 136 mmol/L (ref 135–145)

## 2020-01-30 LAB — TROPONIN I (HIGH SENSITIVITY)
Troponin I (High Sensitivity): 6 ng/L (ref <18)
Troponin I (High Sensitivity): 7 ng/L (ref <18)

## 2020-01-30 NOTE — Discharge Planning (Signed)
Oletta Cohn, RN, BSN, [place] [phone] Pt qualifies for DME rolling walker.  DME  ordered through [facility].  PT to evaluate pt for additional needs.  [employer] notified to deliver rolling walker to pt room prior to D/C home.

## 2020-01-30 NOTE — ED Provider Notes (Signed)
[facility] EMERGENCY DEPARTMENT Provider Note   CSN: [id number] Arrival date & time: 01/29/20  2251     History Chief Complaint  Patient presents with  . Weakness    Paul Trevino is a 69 y.o. male.  HPI    69 year old man presents today stating that his gait is unsteady and he feels that he needs a walker.  He was seen as a code stroke last week on Thursday.  He had a right internal capsule intracranial hemorrhage.  Blood pressure was controlled and patient stabilized and discharged to home on Saturday, October 2.  Patient states that he has continued to have the same symptoms and feels off balance when he walks.  He states that he feels unsafe ambulating without a walker.  He states that he was told prior to discharge that he did not qualify for a walker. Patient states that he has redness and irritation in his right antecubital area where he had an IV.  He states it was red before he was discharged on Saturday and has continued to be red and painful.  There is noted no increase in the redness and he has not had any fever or swelling in the upper part of the arm. No past medical history on file.  Patient Active Problem List   Diagnosis Date Noted  . ICH (intracerebral hemorrhage) (HCC) 01/26/2020    No past surgical history on file.     No family history on file.  Social History   Tobacco Use  . Smoking status: Current Every Day Smoker    Packs/day: 0.50  . Smokeless tobacco: Never Used  Substance Use Topics  . Alcohol use: Not on file  . Drug use: Not on file    Home Medications Prior to Admission medications   Medication Sig Start Date End Date Taking? Authorizing Provider  amLODipine (NORVASC) 5 MG tablet Take 1 tablet (5 mg total) by mouth daily. 01/29/20  Yes Azucena Fallen, MD  pantoprazole (PROTONIX) 40 MG tablet Take 1 tablet (40 mg total) by mouth daily. 01/29/20  Yes Azucena Fallen, MD    Allergies    Patient has no known  allergies.  Review of Systems   Review of Systems  All other systems reviewed and are negative.   Physical Exam Updated Vital Signs BP 117/82 (BP Location: Left Arm)   Pulse 87   Temp 97.8 F (36.6 C) (Oral)   Resp 14   Ht 1.778 m (5\' 10" )   Wt 65.8 kg   SpO2 98%   BMI 20.81 kg/m   Physical Exam Vitals and nursing note reviewed.  Constitutional:      Appearance: Normal appearance.  HENT:     Head: Normocephalic and atraumatic.     Right Ear: External ear normal.     Left Ear: External ear normal.     Mouth/Throat:     Pharynx: Oropharynx is clear.  Eyes:     Extraocular Movements: Extraocular movements intact.     Pupils: Pupils are equal, round, and reactive to light.  Cardiovascular:     Rate and Rhythm: Normal rate and regular rhythm.     Pulses: Normal pulses.  Pulmonary:     Effort: Pulmonary effort is normal.     Breath sounds: Normal breath sounds.  Abdominal:     General: Abdomen is flat.     Palpations: Abdomen is soft.  Musculoskeletal:        General: Normal range of motion.  Cervical back: Normal range of motion.  Skin:    General: Skin is warm.     Capillary Refill: Capillary refill takes less than 2 seconds.  Neurological:     Mental Status: He is alert.     Cranial Nerves: Cranial nerve deficit present.     Sensory: Sensory deficit present.     Motor: Weakness present.     Coordination: Coordination abnormal.     Deep Tendon Reflexes: Reflexes normal.     Comments: Left palmar drift and facial asymmetry Some ataxia on left heel-to-shin  Psychiatric:        Mood and Affect: Mood normal.        Behavior: Behavior normal.     ED Results / Procedures / Treatments   Labs (all labs ordered are listed, but only abnormal results are displayed) Labs Reviewed  BASIC METABOLIC PANEL - Abnormal; Notable for the following components:      Result Value   Glucose, Bld 106 (*)    Creatinine, Ser 1.29 (*)    GFR calc non Af Amer 56 (*)    All  other components within normal limits  CBC  URINALYSIS, ROUTINE W REFLEX MICROSCOPIC  CBG MONITORING, ED  TROPONIN I (HIGH SENSITIVITY)  TROPONIN I (HIGH SENSITIVITY)    EKG None  Radiology No results found.  Procedures Procedures (including critical care time)  Medications Ordered in ED Medications - No data to display  ED Course  I have reviewed the triage vital signs and the nursing notes.  Pertinent labs & imaging results that were available during my care of the patient were reviewed by me and considered in my medical decision making (see chart for details).    MDM Rules/Calculators/A&P                          1 ataxia patient with known recent stroke and some ongoing left-sided weakness which is likely contributing to his feeling of ataxia.  We will attempt to obtain walker for patient 2 left antecubital phlebitis with no evidence of DVT or cellulitis.  Plan warm compresses Discussed return precautions and need for follow-up with patient and voices understanding. Final Clinical Impression(s) / ED Diagnoses Final diagnoses:  Weakness  Phlebitis    Rx / DC Orders ED Discharge Orders    None       Margarita Grizzle, MD 01/30/20 1306

## 2020-01-30 NOTE — Discharge Planning (Signed)
RNCM placed call to pt daughter to confirm that OP Rehab referral was placed and that the office will call them with date and time of first visit.  RNCM did not reach daughter and was unable to leave a messages as voice mailbox was full.

## 2020-01-30 NOTE — Discharge Instructions (Addendum)
Please return if you notice increased redness, swelling, pain, or fever. Follow-up with physical therapy as planned. Return if you're worse anytime

## 2020-01-30 NOTE — Evaluation (Signed)
Physical Therapy Evaluation and Discharge Patient Details Name: Paul Trevino MRN: [id number] DOB: 1950-12-20 Today's Date: 01/30/2020   History of Present Illness  Pt is a 69 y/o male presenting to the ED secondary to increased weakness. Pt with recent admission for ICH. PMH includes smoker.   Clinical Impression  Pt admitted secondary to problem above with deficits below. Pt overall steady with gait this session. Reports weakness had improved in legs. Gave exercises to perform with L hand at home to help with strengthening and coordination. Feel pt would benefit from outpatient PT follow up at d/c to increase independence. No further acute PT needs. Will sign off. If needs change, please re-consult.     Follow Up Recommendations Outpatient PT    Equipment Recommendations  Rolling walker with 5" wheels    Recommendations for Other Services       Precautions / Restrictions Precautions Precautions: Fall Restrictions Weight Bearing Restrictions: No      Mobility  Bed Mobility Overal bed mobility: Independent                Transfers Overall transfer level: Needs assistance Equipment used: None Transfers: Sit to/from Stand Sit to Stand: Supervision         General transfer comment: Supervision for safety.   Ambulation/Gait Ambulation/Gait assistance: Supervision Gait Distance (Feet): 120 Feet Assistive device: None Gait Pattern/deviations: Step-through pattern;Decreased stride length Gait velocity: Decreased   General Gait Details: Supervision for safety for ambulation. Able to perform horizontal and vertical head turns without LOB.   Stairs            Wheelchair Mobility    Modified Rankin (Stroke Patients Only)       Balance Overall balance assessment: No apparent balance deficits (not formally assessed)                                           Pertinent Vitals/Pain Pain Assessment: No/denies pain    Home Living  Family/patient expects to be discharged to:: Private residence Living Arrangements: Parent;Other relatives (mom and sister) Available Help at Discharge: Family;Available 24 hours/day Type of Home: House Home Access: Stairs to enter Entrance Stairs-Rails: Can reach both Entrance Stairs-Number of Steps: 5 Home Layout: One level Home Equipment: None      Prior Function Level of Independence: Independent               Hand Dominance   Dominant Hand: Right    Extremity/Trunk Assessment   Upper Extremity Assessment Upper Extremity Assessment: LUE deficits/detail LUE Deficits / Details: L hand weakness from previous ICH.     Lower Extremity Assessment Lower Extremity Assessment: LLE deficits/detail LLE Deficits / Details: Reports LLE will sometimes "give way".     Cervical / Trunk Assessment Cervical / Trunk Assessment: Normal  Communication   Communication: No difficulties  Cognition Arousal/Alertness: Awake/alert Behavior During Therapy: WFL for tasks assessed/performed Overall Cognitive Status: Within Functional Limits for tasks assessed                                        General Comments      Exercises Other Exercises Other Exercises: Educated about fine motor tasks to perform with L hand.    Assessment/Plan    PT Assessment Patent does not need any further  PT services  PT Problem List         PT Treatment Interventions      PT Goals (Current goals can be found in the Care Plan section)  Acute Rehab PT Goals Patient Stated Goal: to go home today  PT Goal Formulation: With patient Time For Goal Achievement: 01/30/20 Potential to Achieve Goals: Good    Frequency     Barriers to discharge        Co-evaluation               AM-PAC PT "6 Clicks" Mobility  Outcome Measure Help needed turning from your back to your side while in a flat bed without using bedrails?: None Help needed moving from lying on your back to sitting  on the side of a flat bed without using bedrails?: None Help needed moving to and from a bed to a chair (including a wheelchair)?: None Help needed standing up from a chair using your arms (e.g., wheelchair or bedside chair)?: None Help needed to walk in hospital room?: None Help needed climbing 3-5 steps with a railing? : A Little 6 Click Score: 23    End of Session Equipment Utilized During Treatment: Gait belt Activity Tolerance: Patient tolerated treatment well Patient left: in bed;with call bell/phone within reach (on stretcher in ED ) Nurse Communication: Mobility status PT Visit Diagnosis: Other abnormalities of gait and mobility (R26.89);Muscle weakness (generalized) (M62.81)    Time: 7591-6384 PT Time Calculation (min) (ACUTE ONLY): 20 min   Charges:   PT Evaluation $PT Eval Low Complexity: 1 Low          Cindee Salt, DPT  Acute Rehabilitation Services  Pager: [phone] Office: [phone]   Lehman Prom 01/30/2020, 2:09 PM

## 2020-02-10 ENCOUNTER — Other Ambulatory Visit: Payer: Self-pay

## 2020-02-10 ENCOUNTER — Ambulatory Visit: Payer: Medicare Other | Attending: Neurology

## 2020-02-10 DIAGNOSIS — M6281 Muscle weakness (generalized): Secondary | ICD-10-CM | POA: Diagnosis present

## 2020-02-10 DIAGNOSIS — R2681 Unsteadiness on feet: Secondary | ICD-10-CM | POA: Insufficient documentation

## 2020-02-10 DIAGNOSIS — R2689 Other abnormalities of gait and mobility: Secondary | ICD-10-CM | POA: Diagnosis present

## 2020-02-10 NOTE — Therapy (Signed)
[facility] [address] [place], [place], [address] Phone: [phone]   Fax:  [phone]  Physical Therapy Evaluation/Discharge summary  Patient Details  Name: Paul Trevino MRN: [id number] Date of Birth: 11/17/1950 Referring Provider (PT): Dr. Marisue Humble   Encounter Date: 02/10/2020   PT End of Session - 02/10/20 1231    Visit Number 1    Number of Visits 1    Date for PT Re-Evaluation --    Authorization Type Medicare; eval 02/10/20    Progress Note Due on Visit --    PT Start Time 1230    PT Stop Time 1310    PT Time Calculation (min) 40 min    Equipment Utilized During Treatment Gait belt    Activity Tolerance Patient tolerated treatment well    Behavior During Therapy [facility] for tasks assessed/performed           History reviewed. No pertinent past medical history.  History reviewed. No pertinent surgical history.  There were no vitals filed for this visit.    Subjective Assessment - 02/10/20 1225    Subjective Pt was at work on 9/30. Noted L sided weakness, gait changes and slurred speech. EMS was called and brought to ED. CT revealed right internal capsule intracranial hemorrhage.  Was discharged on 01/29/20 to home. Pt revisied ED on 01/30/20 because he felt his balance wasnt good enough and he needed walker. Pt was given walker and discharged back home on 01/30/20.    Limitations Standing;Walking    How long can you sit comfortably? WFL    How long can you stand comfortably? WFL    Patient Stated Goals Improve gait    Currently in Pain? No/denies              [facility] PT Assessment - 02/10/20 1225      Assessment   Medical Diagnosis Intracranial Hemorrhage    Referring Provider (PT) Dr. Marisue Humble    Onset Date/Surgical Date 01/26/20      Precautions   Precautions Fall      Restrictions   Weight Bearing Restrictions No      Balance Screen   Has the patient fallen in the past 6  months No      Prior Function   Level of Independence Independent      Cognition   Overall Cognitive Status Within Functional Limits for tasks assessed      Observation/Other Assessments   Focus on Therapeutic Outcomes (FOTO)  69.67      ROM / Strength   AROM / PROM / Strength Strength      Strength   Strength Assessment Site Hip;Knee;Ankle    Right/Left Hip Right;Left    Right Hip Flexion 5/5    Right Hip ABduction 5/5    Right Hip ADduction 5/5    Left Hip Flexion 5/5    Left Hip ABduction 5/5    Left Hip ADduction 5/5    Right/Left Knee Right;Left    Right Knee Flexion 5/5    Right Knee Extension 5/5    Left Knee Flexion 5/5    Left Knee Extension 5/5    Right/Left Ankle Right;Left    Right Ankle Dorsiflexion 5/5    Left Ankle Dorsiflexion 5/5      Standardized Balance Assessment   Standardized Balance Assessment 10 meter walk test;Berg Balance Test;Dynamic Gait Index      Berg Balance Test   Sit to Stand Able to stand without using  hands and stabilize independently    Standing Unsupported Able to stand safely 2 minutes    Sitting with Back Unsupported but Feet Supported on Floor or Stool Able to sit safely and securely 2 minutes    Stand to Sit Sits safely with minimal use of hands    Transfers Able to transfer safely, minor use of hands    Standing Unsupported with Eyes Closed Able to stand 10 seconds safely    Standing Unsupported with Feet Together Able to place feet together independently and stand 1 minute safely    From Standing, Reach Forward with Outstretched Arm Can reach confidently >25 cm (10")    From Standing Position, Pick up Object from Floor Able to pick up shoe safely and easily    From Standing Position, Turn to Look Behind Over each Shoulder Looks behind from both sides and weight shifts well    Turn 360 Degrees Able to turn 360 degrees safely in 4 seconds or less    Standing Unsupported, Alternately Place Feet on Step/Stool Able to stand  independently and safely and complete 8 steps in 20 seconds    Standing Unsupported, One Foot in Front Able to place foot tandem independently and hold 30 seconds    Standing on One Leg Able to lift leg independently and hold > 10 seconds    Total Score 56      Dynamic Gait Index   Level Surface Normal    Change in Gait Speed Normal    Gait with Horizontal Head Turns Normal    Gait with Vertical Head Turns Normal    Gait and Pivot Turn Normal    Step Over Obstacle Normal    Step Around Obstacles Normal    Steps Normal    Total Score 24                      Objective measurements completed on examination: See above findings.                 PT Short Term Goals - 02/10/20 1227      PT SHORT TERM GOAL #1   Title --    Time --    Period --    Status --    Target Date --      PT SHORT TERM GOAL #2   Title --    Time --    Period --    Status --    Target Date --             PT Long Term Goals - 02/10/20 1313      PT LONG TERM GOAL #1   Title no goals established-pt discharged                  Plan - 02/10/20 1229    Clinical Impression Statement Patient is a 69 y.o. male who was seen today for physical therapy evaluaion and treatment for gait and balanc disorder and weakness as a result of intracranial hemorrhage on 01/26/20. Patient demonstrates 5/5 strength in bil LE. patient demonstrated no significant gait deviations and was able to walk without AD. Patient demonstrated no significant stait or dynamic gait and balance impairments per Sharlene Motts balance scale (56/56) and dynamic gait index (24/24). Patient does not need skilled PT services at this time. patient educated on continue to work on his walking to gradually walk for up to 30 min a day for home exercise routine. Patient will be  discharged from skilled PT    Personal Factors and Comorbidities Time since onset of injury/illness/exacerbation    Examination-Activity Limitations --     Examination-Participation Restrictions Driving;Occupation    Stability/Clinical Decision Making Evolving/Moderate complexity    Clinical Decision Making Moderate    Rehab Potential Good    PT Frequency --    PT Duration --    PT Treatment/Interventions Patient/family education    PT Next Visit Plan Discharge    Consulted and Agree with Plan of Care Patient;Family member/caregiver    Family Member Consulted daughter           Patient will benefit from skilled therapeutic intervention in order to improve the following deficits and impairments:     Visit Diagnosis: Unsteadiness on feet  Other abnormalities of gait and mobility  Muscle weakness (generalized)     Problem List Patient Active Problem List   Diagnosis Date Noted   ICH (intracerebral hemorrhage) (HCC) 01/26/2020    Ileana Ladd, PT 02/10/2020, 1:14 PM  [facility] [address] [place], [place], [address] Phone: [phone]   Fax:  [phone]  Name: Jhonathan Desroches MRN: [id number] Date of Birth: 1950-10-25

## 2020-03-08 ENCOUNTER — Encounter: Payer: Self-pay | Admitting: Adult Health

## 2020-03-08 ENCOUNTER — Ambulatory Visit (INDEPENDENT_AMBULATORY_CARE_PROVIDER_SITE_OTHER): Payer: Medicare Other | Admitting: Adult Health

## 2020-03-08 ENCOUNTER — Other Ambulatory Visit: Payer: Self-pay

## 2020-03-08 VITALS — BP 132/88 | HR 71 | Ht 69.0 in | Wt 133.0 lb

## 2020-03-08 DIAGNOSIS — I10 Essential (primary) hypertension: Secondary | ICD-10-CM | POA: Diagnosis not present

## 2020-03-08 DIAGNOSIS — Z720 Tobacco use: Secondary | ICD-10-CM | POA: Diagnosis not present

## 2020-03-08 DIAGNOSIS — I619 Nontraumatic intracerebral hemorrhage, unspecified: Secondary | ICD-10-CM

## 2020-03-08 NOTE — Progress Notes (Signed)
I agree with the above plan 

## 2020-03-08 NOTE — Progress Notes (Signed)
[facility] [address] [place]. [place] [address] [phone]       HOSPITAL FOLLOW UP NOTE  [name] Date of Birth:  [birth year] Medical Record Number:  [id number]   Reason for Referral:  hospital stroke follow up    SUBJECTIVE:   CHIEF COMPLAINT:  Chief Complaint  Patient presents with  . Hospitalization Follow-up    tx rm, alone, c/o of weakness on left side, and left fingers     HPI:   [name] is a 69 y.o. male with a history of tobacco use who presented to [facility] ED on 01/26/2020 with generalized weakness -> left-sided facial droop along with left-sided weakness in the arm and leg ; BP 180/90 .  Personally reviewed hospitalization pertinent progress notes, lab work and imaging with summary provided.  Initially evaluated by Dr. 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 Hx of HTN not on BP meds PTA and initiated amlodipine 5 mg daily with long-term BP goal normotensive range.  No history of HLD or DM with LDL 65 and A1c 6.0.  Current tobacco use with smoking cessation counseling provided.  Other stroke risk factors include advanced age and UDS positive for THC but no prior stroke history.  Residual deficits of left hemiparesis with therapy recommending outpatient PT/OT and discharged home in stable condition.  ICH: Right internal capsule acute hemorrhage, likely due to hypertension  Resultant mild left hemiparesis  CT Head - Right internal capsule acute hemorrhage  CT head - Unchanged acute hematoma at the right basal ganglia.   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  He has been recovering well since discharge with only mild left arm weakness and numbness.  He was evaluated by PT 02/10/2020 without continued therapy needs.  He has returned back to all prior activities without difficulty and request return back to work with light duty restrictions at [employer] working in [employer].  Denies new stroke/TIA symptoms.  Blood pressure today satisfactory at 132/88 on amlodipine 5 mg daily. Monitors at home which has been stable. Continued tobacco use but does report decreasing amout. Denies any further THC use.  No further concerns at this time.     ROS:   14 system review of systems performed and negative with exception of numbness and weakness  PMH: No past medical history on file.  PSH: No past surgical history on file.  Social History:  Social History   Socioeconomic History  . Marital status: Single    Spouse name: Not on file  . Number of children: Not on file  . Years of education: Not on file  . Highest education level: Not on file  Occupational History  . Not on file  Tobacco Use  . Smoking status: Current Every Day Smoker    Packs/day: 0.50  . Smokeless tobacco: Never Used  Substance and Sexual Activity  . Alcohol use: Not on file  . Drug use: Not on file  .  Sexual activity: Not on file  Other Topics Concern  . Not on file  Social History Narrative  . Not on file   Social Determinants of Health   Financial Resource Strain:   . Difficulty of Paying Living Expenses: Not on file  Food Insecurity:   . Worried About Programme researcher, broadcasting/film/video in the Last Year: Not on file  . Ran Out of Food in the Last Year: Not on file  Transportation Needs:   . Lack of Transportation (Medical): Not on file  . Lack of Transportation (Non-Medical): Not on file  Physical Activity:   .  Days of Exercise per Week: Not on file  . Minutes of Exercise per Session: Not on file  Stress:   . Feeling of Stress : Not on file  Social Connections:   . Frequency of Communication with Friends and Family: Not on file  . Frequency of Social Gatherings with Friends and Family: Not on file  . Attends Religious Services: Not on file  . Active Member of Clubs or Organizations: Not on file  . Attends Banker Meetings: Not on file  . Marital Status: Not on file  Intimate Partner Violence:   . Fear of Current or Ex-Partner: Not on file  . Emotionally Abused: Not on file  . Physically Abused: Not on file  . Sexually Abused: Not on file    Family History: No family history on file.  Medications:   Current Outpatient Medications on File Prior to Visit  Medication Sig Dispense Refill  . amLODipine (NORVASC) 5 MG tablet Take 1 tablet (5 mg total) by mouth daily. 30 tablet 0  . pantoprazole (PROTONIX) 40 MG tablet Take 1 tablet (40 mg total) by mouth daily. 30 tablet 0   No current facility-administered medications on file prior to visit.    Allergies:  No Known Allergies    OBJECTIVE:  Physical Exam  Vitals:   03/08/20 0953  BP: 132/88  Pulse: 71  Weight: 133 lb (60.3 kg)  Height: 5\' 9"  (1.753 m)   Body mass index is 19.64 kg/m. No exam data present  Depression screen [facility] 2/9 03/08/2020  Decreased Interest 0  Down, Depressed, Hopeless 0  PHQ - 2 Score 0     General: well developed, well nourished,  very pleasant middle-aged African-American male, seated, in no evident distress Head: head normocephalic and atraumatic.   Neck: supple with no carotid or supraclavicular bruits Cardiovascular: regular rate and rhythm, no murmurs Musculoskeletal: no deformity Skin:  no rash/petichiae Vascular:  Normal pulses all extremities   Neurologic Exam Mental Status: Awake and fully alert.   Fluent speech and language.  Oriented to place and time. Recent and remote  memory intact. Attention span, concentration and fund of knowledge appropriate. Mood and affect appropriate.  Cranial Nerves: Fundoscopic exam reveals sharp disc margins. Pupils equal, briskly reactive to light. Extraocular movements full without nystagmus. Visual fields full to confrontation. Hearing intact. Facial sensation intact. Face, tongue, palate moves normally and symmetrically.  Motor: Normal bulk and tone. Normal strength in all tested extremity muscles except slight decrease left hand dexterity. Sensory.: intact to touch , pinprick , position and vibratory sensation.  Coordination: Rapid alternating movements normal in all extremities except slightly decreased left hand. Finger-to-nose and heel-to-shin performed accurately bilaterally.  Mildly orbits right arm over left arm. Gait and Station: Arises from chair without difficulty. Stance is normal. Gait demonstrates normal stride length and balance without use of assistive device.  Able  to heel, toe and tandem walk without difficulty Reflexes: 1+ and symmetric. Toes downgoing.     NIHSS  0 Modified Rankin  2     ASSESSMENT: Paul Trevino is a 69 y.o. year old male presented with left facial droop and left hemiparesis as well as BP 180/90 on 01/26/2020 with stroke work-up revealing right internal capsule ICH likely secondary to uncontrolled hypertension. Vascular risk factors include tobacco use, HTN and THC use.      PLAN:  1. Hypertensive R ICH:  a. Residual deficit: mild decrease left hand dexterity and subjective numbness with continued improvement.  Cleared to return back to work with light duty restrictions for at least 4 weeks to ensure safety and further recovery time.  Work note provided. b. No indication for antithrombotic as no prior stroke history or cardiovascular disease.   c. Discussed secondary stroke prevention measures and importance of close PCP follow up for aggressive stroke risk factor management  2. HTN: BP  goal <130/90.  Stable on amlodipine 5 mg daily 3. Tobacco use: Congratulated on decreasing daily amount and encouraged complete cessation 4. THC use: Reports complete cessation and highly encouraged continuation    Follow up in 4 months or call earlier if needed   I spent 45 minutes of face-to-face and non-face-to-face time with patient.  This included previsit chart review, lab review, study review, order entry, electronic health record documentation, patient education regarding recent stroke, residual deficits, importance of managing stroke risk factors and answered all questions to patient satisfaction as well as speaking briefly with daughter via telephone answering all questions to satisfaction   Ihor Austin, [facility]  [facility] [address] [place], [place] [address]  Phone [phone] Fax [phone] Note: This document was prepared with digital dictation and possible smart phrase technology. Any transcriptional errors that result from this process are unintentional.

## 2020-03-08 NOTE — Patient Instructions (Addendum)
You are released to return back to work starting on 11/15 on light duty for at least 4 weeks for further recovery  Continue to follow up with PCP regarding blood pressure management  Maintain strict control of hypertension with blood pressure goal below 130/90    Followup in the future with me in 4 months or call earlier if needed     Thank you for coming to see [place] at [facility]. I hope we have been able to provide you high quality care today.  You may receive a patient satisfaction survey over the next few weeks. We would appreciate your feedback and comments so that we may continue to improve ourselves and the health of our patients.     Hemorrhagic Stroke  A hemorrhagic stroke is the sudden death of brain tissue that occurs when a blood vessel in the brain leaks or bursts (ruptures), causing bleeding in or around the brain(hemorrhage). When this happens, areas of the brain do not get enough oxygen, and blood builds up and presses on areas of the brain. Lack of oxygen and pressure from hemorrhaging can lead to brain damage and death. There are two major types of hemorrhagic stroke:  Intracerebral hemorrhage. This happens if bleeding occurs within the brain tissue.  Subarachnoid hemorrhage. This happens if bleeding occurs in the area between the brain and the membrane that covers the brain (subarachnoid space). Hemorrhagic stroke is a medical emergency. It can cause temporary or permanent brain damage and loss of brain function. What are the causes? This condition is caused by a blood vessel leaking or rupturing, which may be the result of:  Part of a weakened blood vessel wall bulging or ballooning out (cerebral aneurysm).  A hardened, thin blood vessel cracking open and allowing blood to leak out. Blood vessels may become hardened and thin due to plaque buildup.  Tangled blood vessels in the brain (brain arteriovenous malformation).  Protein buildup in  artery walls in the brain (amyloid angiopathy).  Inflamed blood vessels (vasculitis).  A tumor in the brain.  High blood pressure (hypertension). What increases the risk? The following factors may make you more likely to develop this condition:  Hypertension.  Having abnormal blood vessels present since birth (congenital abnormality).  Bleeding disorders, such as hemophilia, sickle cell disease, or liver disease.  The blood becoming too thin while taking blood thinners (anticoagulants).  Aging.  Moderate or heavy alcohol use.  Using drugs, such as cocaine or methamphetamines. What are the signs or symptoms? Symptoms of this condition usually appear suddenly, and may include:  Weakness or numbness of the face, arm, or leg, especially on one side of the body.  Confusion.  Difficulty speaking (aphasia) or understanding speech.  Difficulty seeing out of one or both eyes.  Difficulty walking or moving the arms or legs.  Dizziness.  Loss of balance or coordination.  Seizures.  A severe headache with no known cause. This headache may feel like the worst headache a person has ever experienced. How is this diagnosed? This condition may be diagnosed based on:  Your symptoms.  Your medical history.  A physical exam.  Tests, including: ? Blood tests. ? CT scan. ? MRI. ? CT angiography (CTA) or magnetic resonance angiography (MRA).  Catheter angiogram. In this procedure, dye is injected through a long, thin tube (catheter) into one of your arteries. Then, X-rays are taken. The X-rays will show whether there is a blockage or a problem in a blood vessel. How is  this treated? This condition is a medical emergency that must be treated in a hospital immediately. The goals of treatment are to stop bleeding, reduce pressure on the brain, relieve symptoms, and prevent complications. Treatment for this condition may include:  Medicines that: ? Lower blood pressure  (antihypertensives). ? Relieve pain (analgesics). ? Relieve nausea or vomiting. ? Stop or prevent seizures (anticonvulsants). ? Relieve fever. ? Prevent blood vessels in the brain from spasming in response to bleeding. ? Control bleeding in the brain.  Assisted breathing (ventilation). This involves using a machine to help you breathe (ventilator).  Receiving donated blood products through an IV (transfusion). You will receive cells that help your blood clot.  Placement of a tube (shunt) in the brain to relieve pressure.  Physical, speech, or occupational therapy.  Surgery to stop bleeding, remove a blood clot or tumor, or reduce pressure. Treatment depends on the cause, severity, and duration of symptoms. Medicines and changes to your diet may be used to help treat and manage risk factors for stroke, such as diabetes and high blood pressure. Recovery from hemorrhagic stroke varies widely. Talk with your health care provider about what to expect during your recovery. Follow these instructions at home: Activity  Use a walker or a cane as told by your health care provider.  Return to your normal activities as told by your health care provider. Ask your health care provider what activities are safe for you.  Rest. Rest helps the brain to heal. Make sure you: ? Get plenty of sleep. Avoid staying up late at night. ? Keep a consistent sleep schedule. Try to go to sleep and wake up at about the same time every day. ? Avoid activities that cause physical or mental stress. Lifestyle  Do not drink alcohol if: ? Your health care provider tells you not to drink. ? You are pregnant, may be pregnant, or are planning to become pregnant.  If you drink alcohol: ? Limit how much you use to:  0-1 drink a day for women.  0-2 drinks a day for men. ? Be aware of how much alcohol is in your drink. In the [place] one drink equals one 12 oz bottle of beer (355 mL), one 5 oz glass of wine (148 mL), or  one 1 oz glass of hard liquor (44 mL). General instructions  Do not drive or use heavy machinery until your health care provider approves.  Take over-the-counter and prescription medicines only as told by your health care provider.  Keep all follow-up visits as told by your health care provider, including visits with therapists. This is important. How is this prevented? Your risk of stroke can be decreased by working with your health care provider to treat:  High blood pressure.  High cholesterol.  Diabetes.  Heart disease.  Obesity. Your risk of stroke can also be decreased by quitting smoking, limiting alcohol, and staying physically active. If you take the blood thinner warfarin, have your bloodwork monitored frequently by your health care provider. Contact a health care provider if: You develop any of the following symptoms:  Headaches that keep coming back (chronic headaches).  Nausea.  Vision problems.  Increased sensitivity to noise or light.  Depression or mood swings.  Anxiety or irritability.  Memory problems.  Difficulty concentrating or paying attention.  Sleep problems.  Feeling tired all of the time. Get help right away if you:  Have a partial or total loss of consciousness.  Are taking blood thinners and you  fall or you experience minor injury to the head.  Have a bleeding disorder and you fall or you experience minor trauma to the head.  Have any symptoms of a stroke. "BE FAST" is an easy way to remember the main warning signs of a stroke: ? B - Balance. Signs are dizziness, sudden trouble walking, or loss of balance. ? E - Eyes. Signs are trouble seeing or a sudden change in vision. ? F - Face. Signs are sudden weakness or numbness of the face, or the face or eyelid drooping on one side. ? A - Arms. Signs are weakness or numbness in an arm. This happens suddenly and usually on one side of the body. ? S - Speech. Signs are sudden trouble  speaking, slurred speech, or trouble understanding what people say. ? T - Time. Time to call emergency services. Write down what time symptoms started.  Have other signs of a stroke, such as: ? A sudden, severe headache with no known cause. ? Nausea or vomiting. ? Seizure. These symptoms may represent a serious problem that is an emergency. Do not wait to see if the symptoms will go away. Get medical help right away. Call your local emergency services (911 in the [place] Do not drive yourself to the hospital. Summary  Hemorrhagic stroke is a medical emergency.  Hemorrhagic stroke is caused by bleeding in or around the brain.  Know the signs and symptoms of stroke.  Know your stroke risk factors. Work with your health care provider to decrease your risk of stroke. This information is not intended to replace advice given to you by your health care provider. Make sure you discuss any questions you have with your health care provider. Document Revised: 05/20/2018 Document Reviewed: 05/21/2018 Elsevier Patient Education  2020 [employer].

## 2020-07-12 ENCOUNTER — Ambulatory Visit (INDEPENDENT_AMBULATORY_CARE_PROVIDER_SITE_OTHER): Payer: Medicare PPO | Admitting: Adult Health

## 2020-07-12 ENCOUNTER — Encounter: Payer: Self-pay | Admitting: Adult Health

## 2020-07-12 VITALS — BP 132/82 | HR 98 | Ht 69.0 in | Wt 132.0 lb

## 2020-07-12 DIAGNOSIS — I619 Nontraumatic intracerebral hemorrhage, unspecified: Secondary | ICD-10-CM

## 2020-07-12 DIAGNOSIS — M545 Low back pain, unspecified: Secondary | ICD-10-CM

## 2020-07-12 MED ORDER — GABAPENTIN 300 MG PO CAPS: 300.0000 mg | ORAL_CAPSULE | Freq: Two times a day (BID) | ORAL | 5 refills | Status: AC

## 2020-07-12 MED ORDER — AMLODIPINE BESYLATE 5 MG PO TABS: 5.0000 mg | ORAL_TABLET | Freq: Every day | ORAL | 3 refills | Status: AC

## 2020-07-12 MED ORDER — PANTOPRAZOLE SODIUM 40 MG PO TBEC: 40.0000 mg | DELAYED_RELEASE_TABLET | Freq: Every day | ORAL | 3 refills | Status: AC

## 2020-07-12 NOTE — Patient Instructions (Addendum)
Referral to PT for lower back pain  Use gabapentin 300mg  twice daily for lower back pain  Continue to follow up with PCP regarding blood pressure management  Maintain strict control of hypertension with blood pressure goal below 130/90      Followup in the future with me in 4 months or call earlier if needed       Thank you for coming to see at [facility]. I hope we have been able to provide you high quality care today.  You may receive a patient satisfaction survey over the next few weeks. We would appreciate your feedback and comments so that we may continue to improve ourselves and the health of our patients.

## 2020-07-12 NOTE — Progress Notes (Signed)
[facility] [address] [place]. [place] [address] [phone]       STROKE FOLLOW UP NOTE  [name] Date of Birth:  10-17-1950 Medical Record Number:  [id number]   Reason for Referral: stroke follow up    SUBJECTIVE:   CHIEF COMPLAINT:  Chief Complaint  Patient presents with  . Follow-up    Alone with fiance (marilyn) Pt is having sever back pain but not sure where its coming form, L hand numbness and will drop things     HPI:   Today, 07/12/2020, 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 Denies new stroke/TIA symptoms  Blood pressure today 132/82 compliant on amlodipine 5mg  daily - requests refill as he is not established with PCP  Continued tobacco use reporting approx 5 cigarettes /day but trying to decrease daily amount   He also reports midline lower back pain over the past 1.5 months  At times pain can radiate into left leg Denies incontinence or saddle anesthesia Denies traumatic event prior to onset Slowly worsening since onset - feels tight when sitting or laying for a while reporting stiffness/tightness sensation- pain can improve initially with ambulation but eventually pain will return with prolonged ambulation Has tried Advil, heat and ice without benefit Denies prior back history concerns  No further concerns at this time      History provided for reference purposes only Initial visit 03/08/2020 JM: Mr. [name] is being seen for hospital follow-up unaccompanied.  He has been recovering well since discharge with only mild left arm weakness and numbness.  He was evaluated by PT 02/10/2020 without continued therapy needs.   He has returned back to all prior activities without difficulty and request return back to work with light duty restrictions at [employer] working in [employer].  Denies new stroke/TIA symptoms.  Blood pressure today satisfactory at 132/88 on amlodipine 5 mg daily. Monitors at home which has been stable. Continued tobacco use but does report decreasing amout. Denies any further THC use.  No further concerns at this time.   Stroke admission 01/26/2020 [name] is a 70 y.o. male with a history of tobacco use who presented to [facility] ED on 01/26/2020 with generalized weakness -> left-sided facial droop along with left-sided weakness in the arm and leg ; BP 180/90 .  Personally reviewed hospitalization pertinent progress notes, lab work and imaging with summary provided.  Initially evaluated by Dr. 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 Hx of HTN not on BP meds PTA and initiated amlodipine 5 mg daily with long-term BP goal normotensive range.  No history of HLD or DM with LDL 65 and A1c 6.0.  Current tobacco use with smoking cessation counseling provided.  Other stroke risk factors include advanced age and UDS positive for THC but no prior stroke history.  Residual deficits of left hemiparesis with therapy recommending outpatient PT/OT and discharged home in stable condition.  ICH: Right internal capsule acute hemorrhage, likely due to hypertension  Resultant mild left hemiparesis  CT Head - Right internal capsule acute hemorrhage  CT head - Unchanged acute hematoma at the right basal ganglia.   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 No pertinent past medical history.  PSH: History reviewed. No pertinent surgical history.  Social History:  Social History   Socioeconomic History  . Marital status: Single    Spouse name: Not on file  . Number of children: Not on file  . Years of education: Not on file  . Highest education level: Not on file  Occupational History  . Not on file  Tobacco Use  . Smoking status: Current Every Day Smoker    Packs/day: 0.50  . Smokeless tobacco: Never Used  Substance and Sexual Activity  . Alcohol use: Not on file  . Drug use: Not on file  . Sexual activity: Not on file  Other Topics Concern  . Not on file  Social History Narrative  . Not on file   Social Determinants of Health   Financial Resource Strain: Not on file  Food Insecurity: Not on file  Transportation Needs: Not on file  Physical Activity: Not on file  Stress: Not on file  Social Connections: Not on file  Intimate Partner Violence: Not on file    Family History: History reviewed. No pertinent family history.  Medications:   Current Outpatient Medications on File Prior to Visit  Medication Sig Dispense Refill  . amLODipine (NORVASC) 5 MG tablet Take 1 tablet (5 mg total) by mouth daily. 30 tablet 0  . pantoprazole (PROTONIX) 40 MG tablet Take 1 tablet (40 mg total) by mouth daily. 30 tablet 0   No current facility-administered medications on file prior to visit.    Allergies:  No Known Allergies    OBJECTIVE:  Physical Exam  Vitals:   07/12/20 1045  BP: 132/82  Pulse: 98  Weight: 132 lb (59.9 kg)  Height: 5\' 9"  (1.753 m)   Body mass index is 19.49 kg/m. No exam data present  General: well developed, well nourished,  very pleasant middle-aged African-American male,  seated, in no evident distress Head: head normocephalic and atraumatic.   Neck: supple with no carotid or supraclavicular bruits Cardiovascular: regular rate and rhythm, no murmurs Musculoskeletal: Negative straight leg raise; mild tenderness around paraspinal muscles Skin:  no rash/petichiae Vascular:  Normal pulses all extremities   Neurologic Exam Mental Status: Awake and fully alert. Fluent speech and language. Oriented to place and time. Recent and remote memory intact. Attention span, concentration and fund of knowledge appropriate. Mood and affect appropriate.  Cranial Nerves: Pupils equal, briskly reactive to light. Extraocular movements full without nystagmus. Visual fields full to confrontation. Hearing intact. Facial sensation intact. Face, tongue, palate moves normally and symmetrically.  Motor: Normal bulk and tone. Normal strength in all tested extremity muscles except slight decrease left hand dexterity. Sensory.: intact to touch , pinprick , position and vibratory sensation.  Coordination: Rapid alternating movements normal in all extremities except slightly decreased left hand. Finger-to-nose and heel-to-shin performed accurately bilaterally.  Mildly orbits right arm over left arm.  Gait and Station: Arises from chair without difficulty. Stance is normal. Gait demonstrates  slightly stiffened left leg with mild favoring without use of assistive device.  Mild difficulty with heel, toe and tandem walk Reflexes: 1+ and symmetric. Toes downgoing.         ASSESSMENT: Paul Trevino is a 70 y.o. year old male presented with left facial droop and left hemiparesis as well as BP 180/90 on 01/26/2020 with stroke work-up revealing right internal capsule ICH likely secondary to uncontrolled hypertension. Vascular risk factors include tobacco use, HTN and THC use.  Recent onset of lower back pain with occasional left leg radiculopathy     PLAN:  1. Hypertensive R ICH:  a. Residual  deficit: mild decrease left hand dexterity and subjective numbness with continued improvement.  Cleared to return back to work with light duty restrictions for at least 4 weeks to ensure safety and further recovery time.  Work note provided. b. No indication for antithrombotic as no prior stroke history or cardiovascular disease.   c. Discussed secondary stroke prevention measures and importance of establishing care with PCP for routine follow up and aggressive stroke risk factor management  d. HTN: BP goal <130/90.  Stable on amlodipine 5 mg daily - refill provided 2. Tobacco use: Highly encouraged complete cessation 3. Acute low back pain: No red flags or cauda equina symptoms. No indication at this time for imaging.  Referral to PT. Start gabapentin 300mg  twice daily for radiculopathy symptoms.  If symptoms do not improve, will discuss further evaluation    Follow up in 4 months or call earlier if needed   CC: Dr.  I spent 35 minutes of face-to-face and non-face-to-face time with patient and fianc.  This included previsit chart review, lab review, study review, order entry, electronic health record documentation, patient education regarding prior stroke, residual deficits, importance of managing stroke risk factors, acute lower back pain and possible etiologies with further interventions and answered all other questions to patient satisfaction   Pearlean Brownie, [facility]  [facility] [address] [place], [place]  Phone [phone] Fax [phone] Note: This document was prepared with digital dictation and possible smart phrase technology. Any transcriptional errors that result from this process are unintentional.

## 2020-07-15 NOTE — Progress Notes (Signed)
I agree with the above plan 

## 2020-08-02 ENCOUNTER — Ambulatory Visit: Payer: Medicare PPO | Admitting: Physical Therapy

## 2020-11-15 ENCOUNTER — Ambulatory Visit: Payer: Medicare PPO | Admitting: Adult Health

## 2021-06-23 IMAGING — CT CT ANGIO HEAD
2 of 11 series · 7 of 33 positions shown · IV contrast (OMNI 350)
Comparison: None.

CLINICAL DATA: Stroke follow-up

EXAM:
CT ANGIOGRAPHY HEAD AND NECK
TECHNIQUE: Multidetector CT imaging of the head and neck was performed using
the standard protocol during bolus administration of intravenous
contrast. Multiplanar CT image reconstructions and MIPs were
obtained to evaluate the vascular anatomy. Carotid stenosis
measurements (when applicable) are obtained utilizing NASCET
criteria, using the distal internal carotid diameter as the
denominator.
CONTRAST:  100mL OMNIPAQUE IOHEXOL 350 MG/ML SOLN

[Series 9: cta neck · axial · 0.47mm/px · z∈[-289,-155]mm · 2 of 201 slices shown]
[im 67/201  soft-tissue]
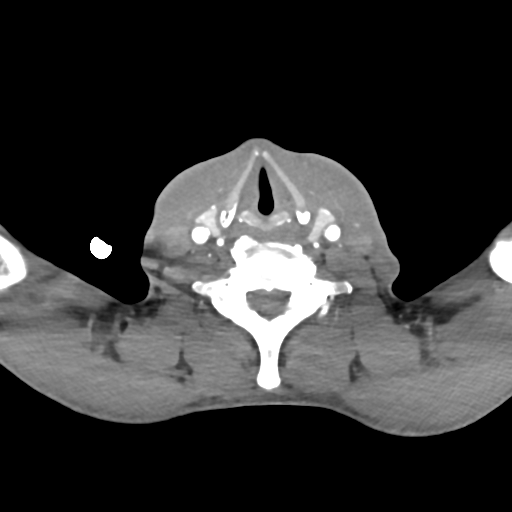
[im 134/201  soft-tissue]
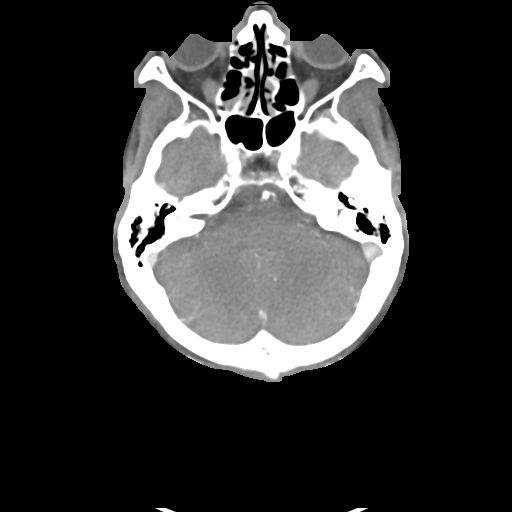

[Series 11: cta neck axial · axial · 0.39mm/px · z∈[-354,-87]mm · 5 of 401 slices shown]
[im 67/401  soft-tissue]
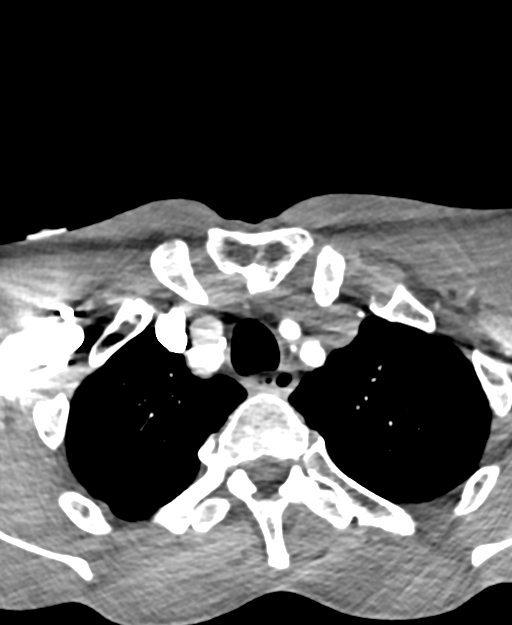
[im 134/401  bone]
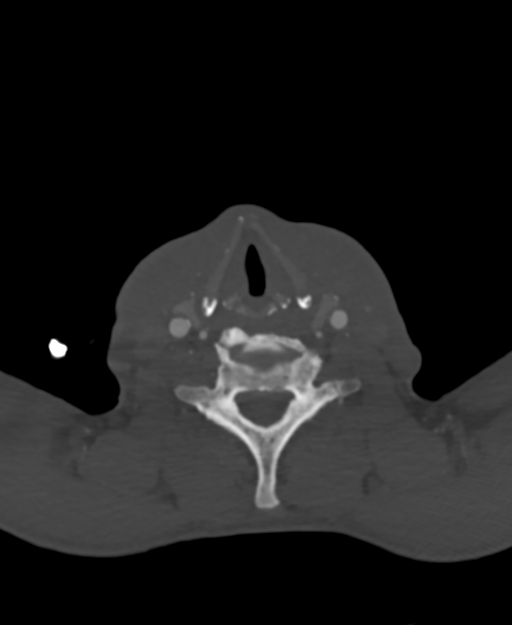
[im 201/401  soft-tissue]
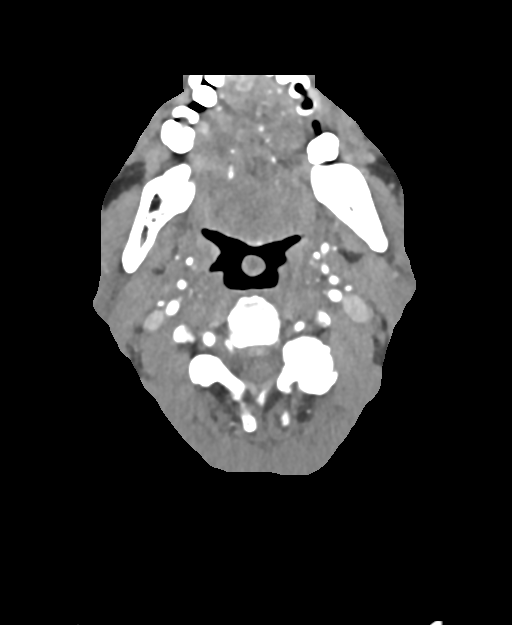
[im 267/401  bone]
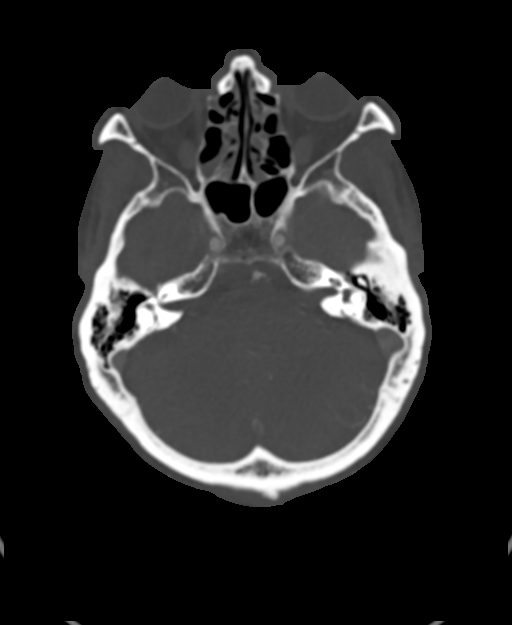
[im 334/401  soft-tissue]
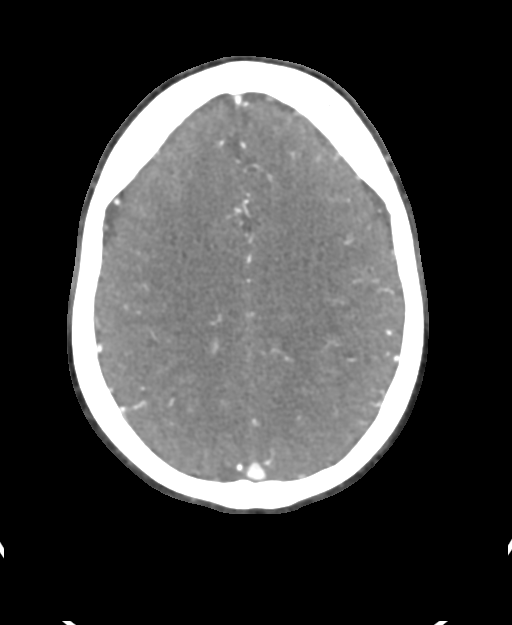

[7 of 33 positions shown; findings below may reference images not displayed]

FINDINGS: CT HEAD FINDINGS

Brain: Unchanged size of right basal ganglia intraparenchymal
hematoma. No new site of hemorrhage. Surrounding edema is unchanged.
The size and configuration of the ventricles and extra-axial CSF
spaces are normal. There is no acute or chronic infarction. The
brain parenchyma is normal.

Skull: The visualized skull base, calvarium and extracranial soft
tissues are normal.

Sinuses/Orbits: Mild bilateral maxillary sinus mucosal thickening.
The orbits are normal.

CTA NECK FINDINGS

SKELETON: There is no bony spinal canal stenosis. No lytic or
blastic lesion.

OTHER NECK: Normal pharynx, larynx and major salivary glands. No
cervical lymphadenopathy. Unremarkable thyroid gland.

UPPER CHEST: Emphysema

AORTIC ARCH:

There is mild calcific atherosclerosis of the aortic arch. There is
no aneurysm, dissection or hemodynamically significant stenosis of
the visualized portion of the aorta. Conventional 3 vessel aortic
branching pattern. Approximately 50% stenosis of the proximal left
subclavian artery due to mixed density plaque.

RIGHT CAROTID SYSTEM: No dissection, occlusion or aneurysm. There is
mixed density atherosclerosis extending into the proximal ICA,
resulting in less than 50% stenosis.

LEFT CAROTID SYSTEM: No dissection, occlusion or aneurysm. There is
mixed density atherosclerosis extending into the proximal ICA,
resulting in less than 50% stenosis.

VERTEBRAL ARTERIES: Right dominant configuration. Both origins are
clearly patent. There is no dissection, occlusion or flow-limiting
stenosis to the skull base (V1-V3 segments).

CTA HEAD FINDINGS

POSTERIOR CIRCULATION:

--Vertebral arteries: Normal V4 segments.

--Inferior cerebellar arteries: Normal.

--Basilar artery: Normal.

--Superior cerebellar arteries: Normal.

--Posterior cerebral arteries (PCA): Normal.

ANTERIOR CIRCULATION:

--Intracranial internal carotid arteries: Normal.

--Anterior cerebral arteries (ACA): Normal. Both A1 segments are
present. Patent anterior communicating artery (a-comm).

--Middle cerebral arteries (MCA): Normal.

VENOUS SINUSES: As permitted by contrast timing, patent.

ANATOMIC VARIANTS: Fetal origin of the right posterior cerebral
artery.

Review of the MIP images confirms the above findings.
IMPRESSION: 1. No emergent large vessel occlusion or high-grade stenosis of the
intracranial arteries.
2. Unchanged size of right basal ganglia hypertensive hemorrhage
with surrounding edema. No aneurysm or arteriovenous malformation.
3. Bilateral carotid bifurcation atherosclerosis with less than 50 %
stenosis.
4. Approximately 50% stenosis of the proximal left subclavian artery
due to mixed density plaque.

Aortic Atherosclerosis (LIJL1-E9B.B) and Emphysema (LIJL1-QZO.3).

## 2024-04-11 ENCOUNTER — Telehealth: Payer: Self-pay | Admitting: General Practice

## 2024-04-11 ENCOUNTER — Ambulatory Visit: Admitting: Family Medicine

## 2024-04-11 NOTE — Telephone Encounter (Signed)
 Called pt and left vm to call office back to reschedule missed appt
